# Patient Record
Sex: Male | Born: 1990 | Race: White | Hispanic: No | Marital: Single | Smoking: Never smoker
Health system: Southern US, Community
[De-identification: ages and names within clinical notes are randomized; demographics above are authoritative.]

## PROBLEM LIST (undated history)

## (undated) DIAGNOSIS — M25529 Pain in unspecified elbow: Secondary | ICD-10-CM

## (undated) DIAGNOSIS — Z9089 Acquired absence of other organs: Secondary | ICD-10-CM

## (undated) HISTORY — DX: Acquired absence of other organs: Z90.89

## (undated) HISTORY — DX: Pain in unspecified elbow: M25.529

## (undated) HISTORY — PX: TONSILLECTOMY AND ADENOIDECTOMY: SHX28

## (undated) HISTORY — PX: TONSILLECTOMY: SUR1361

---
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
## 2017-05-15 DIAGNOSIS — Z578 Occupational exposure to other risk factors: Secondary | ICD-10-CM | POA: Diagnosis not present
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
## 2017-05-16 ENCOUNTER — Emergency Department
Admission: EM | Admit: 2017-05-16 | Discharge: 2017-05-16 | Disposition: A | Discharge status: Home / Self Care | Payer: Worker's Compensation | Attending: Emergency Medicine | Admitting: Emergency Medicine

## 2017-05-16 DIAGNOSIS — Z578 Occupational exposure to other risk factors: Secondary | ICD-10-CM

## 2017-05-16 NOTE — Discharge Instructions (Signed)
Please keep your follow-up appointments with employee health as scheduled return to the emergency department for any concerns.
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
## 2019-04-06 ENCOUNTER — Other Ambulatory Visit: Payer: Self-pay

## 2019-04-06 ENCOUNTER — Emergency Department
Admission: EM | Admit: 2019-04-06 | Discharge: 2019-04-07 | Disposition: A | Discharge status: Home / Self Care | Payer: No Typology Code available for payment source | Attending: Student in an Organized Health Care Education/Training Program | Admitting: Student in an Organized Health Care Education/Training Program

## 2019-04-06 ENCOUNTER — Encounter: Payer: Self-pay | Admitting: Emergency Medicine

## 2019-04-06 ENCOUNTER — Emergency Department: Payer: No Typology Code available for payment source

## 2019-04-06 DIAGNOSIS — Y990 Civilian activity done for income or pay: Secondary | ICD-10-CM | POA: Diagnosis not present

## 2019-04-06 DIAGNOSIS — W1842XA Slipping, tripping and stumbling without falling due to stepping into hole or opening, initial encounter: Secondary | ICD-10-CM | POA: Insufficient documentation

## 2019-04-06 DIAGNOSIS — Y9389 Activity, other specified: Secondary | ICD-10-CM | POA: Insufficient documentation

## 2019-04-06 DIAGNOSIS — Y929 Unspecified place or not applicable: Secondary | ICD-10-CM | POA: Insufficient documentation

## 2019-04-06 DIAGNOSIS — S92154A Nondisplaced avulsion fracture (chip fracture) of right talus, initial encounter for closed fracture: Secondary | ICD-10-CM | POA: Insufficient documentation

## 2019-04-06 DIAGNOSIS — S93401A Sprain of unspecified ligament of right ankle, initial encounter: Secondary | ICD-10-CM | POA: Diagnosis not present

## 2019-04-06 DIAGNOSIS — S99911A Unspecified injury of right ankle, initial encounter: Secondary | ICD-10-CM | POA: Diagnosis present

## 2019-04-06 MED ORDER — IBUPROFEN 800 MG PO TABS
800.0000 mg | ORAL_TABLET | Freq: Once | ORAL | Status: AC
  Administered 2019-04-06: 23:00:00 800 mg via ORAL
  Filled 2019-04-06: qty 1

## 2019-04-06 NOTE — Discharge Instructions (Signed)
Wear the ankle brace until you are cleared by Ortho/Podiatry. Rest with the foot elevated when seated. Apply ice to reduce swelling. Follow-up with [facility] as discussed.

## 2019-04-06 NOTE — ED Triage Notes (Signed)
Pt to triage via w/c with no distress noted, mask in place; pt employed with [employer]; st was tracking and stepped in a hole injuring his left ankle; pt accomp by co-workers Richardson Landry who st per Ahmed Prima, requesting UDS only

## 2019-04-06 NOTE — ED Provider Notes (Signed)
[facility] Emergency Department Provider Note ____________________________________________  Time seen: 2237  I have reviewed the triage vital signs and the nursing notes.  HISTORY  Chief Complaint  Ankle Pain  HPI Gerald Ward is a 28 y.o. male Who works as a Sports coach for [employer], presents to the ED after an accidental injury.  Patient was tracking a suspect with his dog, when he accidentally stepped in a hole that was near the guard rail on the embankment.  He describes injuring his left ankle when he fell onto his right side.  He denies any head injury, laceration, abrasion, or any other injury at this time.   He presents at this time with pain, swelling, and disability to the left ankle.  He gives remote history of previous ankle sprains.  History reviewed. No pertinent past medical history.  There are no active problems to display for this patient.  History reviewed. No pertinent surgical history.  Prior to Admission medications   Not on File    Allergies Patient has no known allergies.  No family history on file.  Social History Social History   Tobacco Use  . Smoking status: Never Smoker  . Smokeless tobacco: Never Used  Substance Use Topics  . Alcohol use: Not on file  . Drug use: Not on file    Review of Systems  Constitutional: Negative for fever. Cardiovascular: Negative for chest pain. Respiratory: Negative for shortness of breath. Musculoskeletal: Negative for back pain. Left ankle pain as above Skin: Negative for rash. Neurological: Negative for headaches, focal weakness or numbness. ____________________________________________  PHYSICAL EXAM:  VITAL SIGNS: ED Triage Vitals [04/06/19 2123]  Enc Vitals Group     BP (!) 127/94     Pulse Rate 79     Resp 18     Temp 98.7 F (37.1 C)     Temp Source Oral     SpO2 97 %     Weight 200 lb (90.7 kg)     Height 6\' 5"  (1.956 m)     Head Circumference      Peak Flow       Pain Score 6     Pain Loc      Pain Edu?      Excl. in [place]?     Constitutional: Alert and oriented. Well appearing and in no distress. Head: Normocephalic and atraumatic. Eyes: Conjunctivae are normal. Normal extraocular movements Cardiovascular: Normal rate, regular rhythm. Normal distal pulses and capillary refill. Respiratory: Normal respiratory effort. No wheezes/rales/rhonchi. Musculoskeletal: Left ankle with obvious lateral soft tissue swelling noted.  No obvious deformity or dislocation is appreciated.  No ecchymosis, bruising, or abrasions noted.  Patient is tender to palpation to the lateral malleolus, the posterior medial lateral fossa of the Achilles tendon, and the calcaneus.  Is able demonstrate normal ankle flexion extension range.  Negative Thompson test.  Knee exam is benign.  Nontender with normal range of motion in all extremities.  Neurologic:  Normal gross sensation. Normal speech and language. No gross focal neurologic deficits are appreciated. Skin:  Skin is warm, dry and intact. No rash noted. ____________________________________________   RADIOLOGY  DG Left Ankle IMPRESSION: 1. Irregular trabecular disruption involving the talar neck, suspicious for possible fracture. 2. Additional lucency through a bony excrescence along the anterior tibiotalar joint, possibly reflecting a fragmented osteophyte versus more remote injury. 3. Circumferential swelling and small to moderate ankle effusion. 4. Additional corticated fragments distal to the mediolateral malleoli suggest remote injury  as well. ____________________________________________  PROCEDURES  IBU 800 mg PO .Splint Application  Date/Time: 04/06/2019 11:08 PM Performed by: Kern Reap A, NT Authorized by: Lissa Hoard, PA-C   Consent:    Consent obtained:  Verbal   Consent given by:  Patient   Risks discussed:  Pain   Alternatives discussed:  No treatment Pre-procedure details:     Sensation:  Normal Procedure details:    Laterality:  Left   Location:  Ankle   Ankle:  L ankle   Strapping: no     Splint type:  Sugar tong   Supplies:  Ortho-Glass, cotton padding and elastic bandage Post-procedure details:    Pain:  Improved   Sensation:  Normal   Patient tolerance of procedure:  Tolerated well, no immediate complications  ____________________________________________  INITIAL IMPRESSION / ASSESSMENT AND PLAN / ED COURSE  Patient presents to the ED for evaluation of injury sustained following mechanical fall.  Patient describes stepping in a hole, and falling while chasing a suspect.  He presents with pain to the lateral left ankle and disability.  X-ray does reveal a questionable talar dome injury.  Given patient's pain and disability he will be placed in a sugar tong OCL splint, and given crutches to ambulate nonweightbearing until he is evaluated by Ortho/podiatry.  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  Gerald Ward was evaluated in Emergency Department on 04/07/2019 for the symptoms described in the history of present illness. He was evaluated in the context of the global COVID-19 pandemic, which necessitated consideration that the patient might be at risk for infection with the SARS-CoV-2 virus that causes COVID-19. Institutional protocols and algorithms that pertain to the evaluation of patients at risk for COVID-19 are in a state of rapid change based on information released by regulatory bodies including the CDC and federal and state organizations. These policies and algorithms were followed during the patient's care in the ED. ____________________________________________  FINAL CLINICAL IMPRESSION(S) / ED DIAGNOSES  Final diagnoses:  Sprain of right ankle, unspecified ligament, initial encounter  Closed nondisplaced avulsion fracture of right talus, initial encounter      Lissa Hoard,  PA-C 04/07/19 0001    Willy Eddy, MD 04/09/19 [phone]

## 2019-04-08 ENCOUNTER — Telehealth: Payer: Self-pay

## 2019-04-08 NOTE — Telephone Encounter (Signed)
Workers [employer] Received ED visit notes for visit on 04/06/2019.  Notes reviewed by Laurey Morale, PA-C (Interim Provider).  Ramelo scheduled to follow-up with [facility] 03/2019.  AMD

## 2019-07-29 ENCOUNTER — Other Ambulatory Visit: Payer: Self-pay

## 2019-07-29 DIAGNOSIS — B009 Herpesviral infection, unspecified: Secondary | ICD-10-CM

## 2019-07-30 NOTE — Telephone Encounter (Signed)
Need pharmacy

## 2019-08-01 ENCOUNTER — Telehealth: Payer: Self-pay

## 2019-08-01 ENCOUNTER — Other Ambulatory Visit: Payer: Self-pay | Admitting: Physician Assistant

## 2019-08-01 DIAGNOSIS — B009 Herpesviral infection, unspecified: Secondary | ICD-10-CM

## 2019-08-01 MED ORDER — VALACYCLOVIR HCL 1 G PO TABS: ORAL_TABLET | ORAL | 1 refills | Status: DC

## 2019-08-01 NOTE — Telephone Encounter (Signed)
Durward Parcel, PA-C tried to send Rx Refill for Valacyclovir (Valtrex) 100 mg tablets electronically through Epic Take 2 tablets po q 12 hrs for 2 doses as directed Qty 20 1 Refill  The above Rx printed out 2x.  Called [facility] [phone]) & spoke with pharmacist Alycia Rossetti) & was informed Rx refill didn't come to them electronically.  Gave verbal order to [facility] for above Rx Refill .  AMD

## 2019-09-06 NOTE — Progress Notes (Signed)
Scheduled to complete physical 09/28/19 (Provider TBD).  AMD

## 2019-09-07 ENCOUNTER — Other Ambulatory Visit: Payer: Self-pay

## 2019-09-07 ENCOUNTER — Ambulatory Visit: Payer: Self-pay

## 2019-09-07 DIAGNOSIS — Z0000 Encounter for general adult medical examination without abnormal findings: Secondary | ICD-10-CM

## 2019-09-07 LAB — POCT URINALYSIS DIPSTICK
Bilirubin, UA: NEGATIVE
Blood, UA: NEGATIVE
Glucose, UA: NEGATIVE
Ketones, UA: NEGATIVE
Leukocytes, UA: NEGATIVE
Nitrite, UA: NEGATIVE
Protein, UA: POSITIVE — AB
Spec Grav, UA: 1.025 (ref 1.010–1.025)
Urobilinogen, UA: 0.2 E.U./dL
pH, UA: 6 (ref 5.0–8.0)

## 2019-09-08 LAB — CMP12+LP+TP+TSH+6AC+CBC/D/PLT
ALT: 55 IU/L — ABNORMAL HIGH (ref 0–44)
AST: 66 IU/L — ABNORMAL HIGH (ref 0–40)
Albumin/Globulin Ratio: 2 (ref 1.2–2.2)
Albumin: 4.6 g/dL (ref 4.1–5.2)
Alkaline Phosphatase: 44 IU/L (ref 39–117)
BUN/Creatinine Ratio: 12 (ref 9–20)
BUN: 15 mg/dL (ref 6–20)
Basophils Absolute: 0 10*3/uL (ref 0.0–0.2)
Basos: 1 %
Bilirubin Total: 0.6 mg/dL (ref 0.0–1.2)
Calcium: 9 mg/dL (ref 8.7–10.2)
Chloride: 102 mmol/L (ref 96–106)
Chol/HDL Ratio: 4.9 ratio (ref 0.0–5.0)
Cholesterol, Total: 133 mg/dL (ref 100–199)
Creatinine, Ser: 1.24 mg/dL (ref 0.76–1.27)
EOS (ABSOLUTE): 0 10*3/uL (ref 0.0–0.4)
Eos: 1 %
Estimated CHD Risk: 1 times avg. (ref 0.0–1.0)
Free Thyroxine Index: 1.9 (ref 1.2–4.9)
GFR calc Af Amer: 92 mL/min/{1.73_m2} (ref >59)
GFR calc non Af Amer: 80 mL/min/{1.73_m2} (ref >59)
GGT: 9 IU/L (ref 0–65)
Globulin, Total: 2.3 g/dL (ref 1.5–4.5)
Glucose: 86 mg/dL (ref 65–99)
HDL: 27 mg/dL — ABNORMAL LOW (ref >39)
Hematocrit: 45.3 % (ref 37.5–51.0)
Hemoglobin: 15.5 g/dL (ref 13.0–17.7)
Immature Grans (Abs): 0 10*3/uL (ref 0.0–0.1)
Immature Granulocytes: 0 %
Iron: 141 ug/dL (ref 38–169)
LDH: 253 IU/L — ABNORMAL HIGH (ref 121–224)
LDL Chol Calc (NIH): 93 mg/dL (ref 0–99)
Lymphocytes Absolute: 2.6 10*3/uL (ref 0.7–3.1)
Lymphs: 45 %
MCH: 32.2 pg (ref 26.6–33.0)
MCHC: 34.2 g/dL (ref 31.5–35.7)
MCV: 94 fL (ref 79–97)
Monocytes Absolute: 0.6 10*3/uL (ref 0.1–0.9)
Monocytes: 10 %
Neutrophils Absolute: 2.5 10*3/uL (ref 1.4–7.0)
Neutrophils: 43 %
Phosphorus: 2.6 mg/dL — ABNORMAL LOW (ref 2.8–4.1)
Platelets: 241 10*3/uL (ref 150–450)
Potassium: 4.3 mmol/L (ref 3.5–5.2)
RBC: 4.81 x10E6/uL (ref 4.14–5.80)
RDW: 11.8 % (ref 11.6–15.4)
Sodium: 140 mmol/L (ref 134–144)
T3 Uptake Ratio: 33 % (ref 24–39)
T4, Total: 5.7 ug/dL (ref 4.5–12.0)
TSH: 0.948 u[IU]/mL (ref 0.450–4.500)
Total Protein: 6.9 g/dL (ref 6.0–8.5)
Triglycerides: 60 mg/dL (ref 0–149)
Uric Acid: 5.9 mg/dL (ref 3.8–8.4)
VLDL Cholesterol Cal: 13 mg/dL (ref 5–40)
WBC: 5.7 10*3/uL (ref 3.4–10.8)

## 2019-09-28 ENCOUNTER — Encounter: Payer: Self-pay | Admitting: Emergency Medicine

## 2019-09-28 DIAGNOSIS — Z0000 Encounter for general adult medical examination without abnormal findings: Secondary | ICD-10-CM

## 2019-11-15 ENCOUNTER — Ambulatory Visit: Payer: Self-pay | Admitting: Physician Assistant

## 2019-11-15 ENCOUNTER — Encounter: Payer: Self-pay | Admitting: Physician Assistant

## 2019-11-15 ENCOUNTER — Other Ambulatory Visit: Payer: Self-pay

## 2019-11-15 VITALS — BP 130/90 | HR 81 | Temp 98.4°F | Resp 14 | Ht 77.0 in | Wt 208.0 lb

## 2019-11-15 DIAGNOSIS — Z0000 Encounter for general adult medical examination without abnormal findings: Secondary | ICD-10-CM

## 2019-11-15 NOTE — Progress Notes (Signed)
° °  Subjective: Annual physical exam    Patient ID: Gerald Ward, male    DOB: [birth year], 29 y.o.   MRN: [id number]  HPI Patient presents annual physical exam voices no concerns or complaints.   Review of Systems    Negative o Objective:   Physical Exam No acute distress.  BMI is 24.67.  HEENT is unremarkable.  Neck is supple for adenopathy or bruits.  Lungs clear to auscultation.  Heart regular rate and rhythm.  Abdomen with negative HSM, normoactive bowel sounds, soft nontender palpation.  No obvious deformity to upper or lower extremities.  Patient has full and equal range of motion of the upper and lower extremities.  No obvious deformity to the cervical lumbar spine.  Patient has full and equal range of motion of the cervical or lumbar spine.  Cranial nerves II through XII grossly intact.     Assessment & Plan:

## 2019-12-02 ENCOUNTER — Encounter: Payer: Self-pay | Admitting: Emergency Medicine

## 2019-12-02 ENCOUNTER — Other Ambulatory Visit: Payer: Self-pay

## 2019-12-02 ENCOUNTER — Emergency Department
Admission: EM | Admit: 2019-12-02 | Discharge: 2019-12-02 | Disposition: A | Discharge status: Home / Self Care | Payer: 59 | Attending: Emergency Medicine | Admitting: Emergency Medicine

## 2019-12-02 ENCOUNTER — Emergency Department: Payer: 59

## 2019-12-02 DIAGNOSIS — N50812 Left testicular pain: Secondary | ICD-10-CM | POA: Insufficient documentation

## 2019-12-02 DIAGNOSIS — N50819 Testicular pain, unspecified: Secondary | ICD-10-CM

## 2019-12-02 DIAGNOSIS — R52 Pain, unspecified: Secondary | ICD-10-CM

## 2019-12-02 LAB — COMPREHENSIVE METABOLIC PANEL
ALT: 40 U/L (ref 0–44)
AST: 35 U/L (ref 15–41)
Albumin: 4.6 g/dL (ref 3.5–5.0)
Alkaline Phosphatase: 57 U/L (ref 38–126)
Anion gap: 9 (ref 5–15)
BUN: 17 mg/dL (ref 6–20)
CO2: 25 mmol/L (ref 22–32)
Calcium: 9.2 mg/dL (ref 8.9–10.3)
Chloride: 103 mmol/L (ref 98–111)
Creatinine, Ser: 1.2 mg/dL (ref 0.61–1.24)
GFR calc Af Amer: >60 mL/min (ref >60)
GFR calc non Af Amer: >60 mL/min (ref >60)
Glucose, Bld: 101 mg/dL — ABNORMAL HIGH (ref 70–99)
Potassium: 3.9 mmol/L (ref 3.5–5.1)
Sodium: 137 mmol/L (ref 135–145)
Total Bilirubin: 0.9 mg/dL (ref 0.3–1.2)
Total Protein: 7.5 g/dL (ref 6.5–8.1)

## 2019-12-02 LAB — URINALYSIS, COMPLETE (UACMP) WITH MICROSCOPIC
Bacteria, UA: NONE SEEN
Bilirubin Urine: NEGATIVE
Glucose, UA: NEGATIVE mg/dL
Hgb urine dipstick: NEGATIVE
Ketones, ur: NEGATIVE mg/dL
Leukocytes,Ua: NEGATIVE
Nitrite: NEGATIVE
Protein, ur: NEGATIVE mg/dL
Specific Gravity, Urine: 1.017 (ref 1.005–1.030)
pH: 6 (ref 5.0–8.0)

## 2019-12-02 LAB — CBC
HCT: 42.6 % (ref 39.0–52.0)
Hemoglobin: 15.5 g/dL (ref 13.0–17.0)
MCH: 32.6 pg (ref 26.0–34.0)
MCHC: 36.4 g/dL — ABNORMAL HIGH (ref 30.0–36.0)
MCV: 89.7 fL (ref 80.0–100.0)
Platelets: 235 10*3/uL (ref 150–400)
RBC: 4.75 MIL/uL (ref 4.22–5.81)
RDW: 11.6 % (ref 11.5–15.5)
WBC: 7 10*3/uL (ref 4.0–10.5)
nRBC: 0 % (ref 0.0–0.2)

## 2019-12-02 MED ORDER — SODIUM CHLORIDE 0.9% FLUSH: 3.0000 mL | Freq: Once | INTRAVENOUS | Status: DC

## 2019-12-02 NOTE — ED Triage Notes (Signed)
Left groin pain on and off since yesterday.  No injury.

## 2019-12-02 NOTE — ED Provider Notes (Signed)
[facility] Emergency Department Provider Note  ____________________________________________  Time seen: Approximately 11:23 AM  I have reviewed the triage vital signs and the nursing notes.   HISTORY  Chief Complaint Groin Pain    HPI Gerald Ward is a 29 y.o. male who complains of left testicular pain that started yesterday.  Waxing and waning, ranging from 6/10-2/10 pain, currently feeling better.  Denies dysuria frequency urgency.  No trauma.  No hematuria.  No penile discharge.  Nonradiating.  No aggravating or alleviating factors.      Past Medical History:  Diagnosis Date  . Hx of tonsillectomy      There are no problems to display for this patient.    Past Surgical History:  Procedure Laterality Date  . TONSILLECTOMY    . TONSILLECTOMY AND ADENOIDECTOMY       Prior to Admission medications   Medication Sig Start Date End Date Taking? Authorizing Provider  valACYclovir (VALTREX) 1000 MG tablet Take 2 tablets by mouth every 12 hours for 2 doses as directed 08/01/19   Joni Reining, PA-C     Allergies Patient has no known allergies.   No family history on file.  Social History Social History   Tobacco Use  . Smoking status: Never Smoker  . Smokeless tobacco: Never Used  Substance Use Topics  . Alcohol use: Yes  . Drug use: Never    Review of Systems  Constitutional:   No fever or chills.  ENT:   No sore throat. No rhinorrhea. Cardiovascular:   No chest pain or syncope. Respiratory:   No dyspnea or cough. Gastrointestinal:   Negative for abdominal pain, vomiting and diarrhea.  Musculoskeletal:   Negative for focal pain or swelling All other systems reviewed and are negative except as documented above in ROS and HPI.  ____________________________________________   PHYSICAL EXAM:  VITAL SIGNS: ED Triage Vitals  Enc Vitals Group     BP 12/02/19 1034 (!) 129/99     Pulse Rate 12/02/19 1034 78     Resp 12/02/19 1034  18     Temp 12/02/19 1034 98 F (36.7 C)     Temp src --      SpO2 12/02/19 1034 97 %     Weight 12/02/19 1028 207 lb 3.7 oz (94 kg)     Height 12/02/19 1028 6\' 5"  (1.956 m)     Head Circumference --      Peak Flow --      Pain Score 12/02/19 1028 3     Pain Loc --      Pain Edu? --      Excl. in GC? --     Vital signs reviewed, nursing assessments reviewed.   Constitutional:   Alert and oriented. Non-toxic appearance. Eyes:   Conjunctivae are normal. EOMI. ENT      Head:   Normocephalic and atraumatic.      Nose:   Wearing a mask.      Mouth/Throat:   Wearing a mask.      Neck:   No meningismus. Full ROM. Hematological/Lymphatic/Immunilogical:   No inguinal lymphadenopathy. Cardiovascular:   RRR. Respiratory:   Normal respiratory effort without tachypnea/retractions. Gastrointestinal:   Soft and nontender. Non distended. There is no CVA tenderness.  No rebound, rigidity, or guarding. Genitourinary:   Normal, examined while standing, no inguinal hernia.  Mild tenderness left posterior testicle.  No horizontal lie.  No inflammatory changes.  No genital lesions. Musculoskeletal:   Normal range of  motion in all extremities. No joint effusions.  No lower extremity tenderness.  No edema. Neurologic:   Normal speech and language.  Motor grossly intact. No acute focal neurologic deficits are appreciated.  Skin:    Skin is warm, dry and intact. No rash noted.  No petechiae, purpura, or bullae.  ____________________________________________    LABS (pertinent positives/negatives) (all labs ordered are listed, but only abnormal results are displayed) Labs Reviewed  COMPREHENSIVE METABOLIC PANEL - Abnormal; Notable for the following components:      Result Value   Glucose, Bld 101 (*)    All other components within normal limits  CBC - Abnormal; Notable for the following components:   MCHC 36.4 (*)    All other components within normal limits  URINALYSIS, COMPLETE (UACMP) WITH  MICROSCOPIC - Abnormal; Notable for the following components:   Color, Urine YELLOW (*)    APPearance CLEAR (*)    All other components within normal limits   ____________________________________________   EKG    ____________________________________________    RADIOLOGY  US SCROTUM W/DOPPLER  Result Date: 12/02/2019 CLINICAL DATA:  Left testicular pain for 1 day EXAM: SCROTAL ULTRASOUND DOPPLER ULTRASOUND OF THE TESTICLES TECHNIQUE: Complete ultrasound examination of the testicles, epididymis, and other scrotal structures was performed. Color and spectral Doppler ultrasound were also utilized to evaluate blood flow to the testicles. COMPARISON:  None. FINDINGS: Right testicle Measurements: 4.6 x 2.5 x 2.6 cm. No mass or microlithiasis visualized. Left testicle Measurements: 5.0 x 1.9 x 2.6 cm. No mass or microlithiasis visualized. Right epididymis:  Normal in size and appearance. Left epididymis:  Normal in size and appearance. Hydrocele:  None visualized. Varicocele:  None visualized. Pulsed Doppler interrogation of both testes demonstrates normal low resistance arterial and venous waveforms bilaterally. IMPRESSION: Normal ultrasound examination of the bilateral testicles and scrotum. There is symmetric arterial and venous Doppler flow bilaterally. Electronically Signed   By: Lauralyn Primes M.D.   On: 12/02/2019 12:14    ____________________________________________   PROCEDURES Procedures  ____________________________________________  DIFFERENTIAL DIAGNOSIS   Epididymitis, functional pain, contusion, sliding hernia  CLINICAL IMPRESSION / ASSESSMENT AND PLAN / ED COURSE  Medications ordered in the ED: Medications  sodium chloride flush (NS) 0.9 % injection 3 mL (has no administration in time range)    Pertinent labs & imaging results that were available during my care of the patient were reviewed by me and considered in my medical decision making (see chart for  details).  Gerald Ward was evaluated in Emergency Department on 12/02/2019 for the symptoms described in the history of present illness. He was evaluated in the context of the global COVID-19 pandemic, which necessitated consideration that the patient might be at risk for infection with the SARS-CoV-2 virus that causes COVID-19. Institutional protocols and algorithms that pertain to the evaluation of patients at risk for COVID-19 are in a state of rapid change based on information released by regulatory bodies including the CDC and federal and state organizations. These policies and algorithms were followed during the patient's care in the ED.   Patient presents with intermittent left testicular pain.  Doubt torsion, UTI, STI.  No incarcerated hernia on exam.  Doubt diverticulitis, ureterolithiasis, appendicitis or other intra-abdominal pathology.  Labs unremarkable including creatinine, urinalysis.  Will obtain scrotal ultrasound with Doppler.   ----------------------------------------- 12:24 PM on 12/02/2019 -----------------------------------------  Scrotal ultrasound normal.  Will use supportive care with NSAIDs, supportive underwear, follow-up if not resolved in the next few days.  ____________________________________________   FINAL CLINICAL IMPRESSION(S) / ED DIAGNOSES    Final diagnoses:  Pain in testicle, unspecified laterality     ED Discharge Orders    None      Portions of this note were generated with dragon dictation software. Dictation errors may occur despite best attempts at proofreading.   Sharman Cheek, MD 12/02/19 1224

## 2019-12-02 NOTE — Discharge Instructions (Addendum)
Your lab tests and scrotum ultrasound were normal today.  Use anti-inflammatory medicine such as ibuprofen or naproxen for pain and wear supportive underwear for the next few days.  If pain has not resolved by Monday, please follow up with primary care.

## 2020-03-01 DIAGNOSIS — M62830 Muscle spasm of back: Secondary | ICD-10-CM | POA: Diagnosis not present

## 2020-03-01 DIAGNOSIS — M9903 Segmental and somatic dysfunction of lumbar region: Secondary | ICD-10-CM | POA: Diagnosis not present

## 2020-03-01 DIAGNOSIS — M955 Acquired deformity of pelvis: Secondary | ICD-10-CM | POA: Diagnosis not present

## 2020-03-01 DIAGNOSIS — M9905 Segmental and somatic dysfunction of pelvic region: Secondary | ICD-10-CM | POA: Diagnosis not present

## 2020-03-02 DIAGNOSIS — M955 Acquired deformity of pelvis: Secondary | ICD-10-CM | POA: Diagnosis not present

## 2020-03-02 DIAGNOSIS — M9903 Segmental and somatic dysfunction of lumbar region: Secondary | ICD-10-CM | POA: Diagnosis not present

## 2020-03-02 DIAGNOSIS — M62830 Muscle spasm of back: Secondary | ICD-10-CM | POA: Diagnosis not present

## 2020-03-02 DIAGNOSIS — M9905 Segmental and somatic dysfunction of pelvic region: Secondary | ICD-10-CM | POA: Diagnosis not present

## 2020-03-05 DIAGNOSIS — M955 Acquired deformity of pelvis: Secondary | ICD-10-CM | POA: Diagnosis not present

## 2020-03-05 DIAGNOSIS — M9903 Segmental and somatic dysfunction of lumbar region: Secondary | ICD-10-CM | POA: Diagnosis not present

## 2020-03-05 DIAGNOSIS — M62830 Muscle spasm of back: Secondary | ICD-10-CM | POA: Diagnosis not present

## 2020-03-05 DIAGNOSIS — M9905 Segmental and somatic dysfunction of pelvic region: Secondary | ICD-10-CM | POA: Diagnosis not present

## 2020-03-07 DIAGNOSIS — M9905 Segmental and somatic dysfunction of pelvic region: Secondary | ICD-10-CM | POA: Diagnosis not present

## 2020-03-07 DIAGNOSIS — M62830 Muscle spasm of back: Secondary | ICD-10-CM | POA: Diagnosis not present

## 2020-03-07 DIAGNOSIS — M9903 Segmental and somatic dysfunction of lumbar region: Secondary | ICD-10-CM | POA: Diagnosis not present

## 2020-03-07 DIAGNOSIS — M955 Acquired deformity of pelvis: Secondary | ICD-10-CM | POA: Diagnosis not present

## 2020-03-09 DIAGNOSIS — M9903 Segmental and somatic dysfunction of lumbar region: Secondary | ICD-10-CM | POA: Diagnosis not present

## 2020-03-09 DIAGNOSIS — M9905 Segmental and somatic dysfunction of pelvic region: Secondary | ICD-10-CM | POA: Diagnosis not present

## 2020-03-09 DIAGNOSIS — M955 Acquired deformity of pelvis: Secondary | ICD-10-CM | POA: Diagnosis not present

## 2020-03-09 DIAGNOSIS — M62830 Muscle spasm of back: Secondary | ICD-10-CM | POA: Diagnosis not present

## 2020-03-12 DIAGNOSIS — M9903 Segmental and somatic dysfunction of lumbar region: Secondary | ICD-10-CM | POA: Diagnosis not present

## 2020-03-12 DIAGNOSIS — M955 Acquired deformity of pelvis: Secondary | ICD-10-CM | POA: Diagnosis not present

## 2020-03-12 DIAGNOSIS — M9905 Segmental and somatic dysfunction of pelvic region: Secondary | ICD-10-CM | POA: Diagnosis not present

## 2020-03-12 DIAGNOSIS — M62830 Muscle spasm of back: Secondary | ICD-10-CM | POA: Diagnosis not present

## 2020-03-16 DIAGNOSIS — M9905 Segmental and somatic dysfunction of pelvic region: Secondary | ICD-10-CM | POA: Diagnosis not present

## 2020-03-16 DIAGNOSIS — M955 Acquired deformity of pelvis: Secondary | ICD-10-CM | POA: Diagnosis not present

## 2020-03-16 DIAGNOSIS — M62830 Muscle spasm of back: Secondary | ICD-10-CM | POA: Diagnosis not present

## 2020-03-16 DIAGNOSIS — M9903 Segmental and somatic dysfunction of lumbar region: Secondary | ICD-10-CM | POA: Diagnosis not present

## 2020-03-28 DIAGNOSIS — M62830 Muscle spasm of back: Secondary | ICD-10-CM | POA: Diagnosis not present

## 2020-03-28 DIAGNOSIS — M9901 Segmental and somatic dysfunction of cervical region: Secondary | ICD-10-CM | POA: Diagnosis not present

## 2020-03-28 DIAGNOSIS — M9905 Segmental and somatic dysfunction of pelvic region: Secondary | ICD-10-CM | POA: Diagnosis not present

## 2020-03-28 DIAGNOSIS — M955 Acquired deformity of pelvis: Secondary | ICD-10-CM | POA: Diagnosis not present

## 2020-03-30 DIAGNOSIS — B001 Herpesviral vesicular dermatitis: Secondary | ICD-10-CM | POA: Insufficient documentation

## 2020-04-02 DIAGNOSIS — M25512 Pain in left shoulder: Secondary | ICD-10-CM | POA: Diagnosis not present

## 2020-04-11 DIAGNOSIS — M9905 Segmental and somatic dysfunction of pelvic region: Secondary | ICD-10-CM | POA: Diagnosis not present

## 2020-04-11 DIAGNOSIS — M9901 Segmental and somatic dysfunction of cervical region: Secondary | ICD-10-CM | POA: Diagnosis not present

## 2020-04-11 DIAGNOSIS — M955 Acquired deformity of pelvis: Secondary | ICD-10-CM | POA: Diagnosis not present

## 2020-04-11 DIAGNOSIS — M62830 Muscle spasm of back: Secondary | ICD-10-CM | POA: Diagnosis not present

## 2020-04-19 DIAGNOSIS — M25512 Pain in left shoulder: Secondary | ICD-10-CM | POA: Diagnosis not present

## 2020-04-19 DIAGNOSIS — M7542 Impingement syndrome of left shoulder: Secondary | ICD-10-CM | POA: Diagnosis not present

## 2020-04-27 DIAGNOSIS — M25512 Pain in left shoulder: Secondary | ICD-10-CM | POA: Diagnosis not present

## 2020-04-27 DIAGNOSIS — M7542 Impingement syndrome of left shoulder: Secondary | ICD-10-CM | POA: Diagnosis not present

## 2020-05-02 DIAGNOSIS — M25512 Pain in left shoulder: Secondary | ICD-10-CM | POA: Diagnosis not present

## 2020-05-02 DIAGNOSIS — M7542 Impingement syndrome of left shoulder: Secondary | ICD-10-CM | POA: Diagnosis not present

## 2020-05-04 DIAGNOSIS — M25512 Pain in left shoulder: Secondary | ICD-10-CM | POA: Diagnosis not present

## 2020-05-04 DIAGNOSIS — M7542 Impingement syndrome of left shoulder: Secondary | ICD-10-CM | POA: Diagnosis not present

## 2020-07-30 ENCOUNTER — Other Ambulatory Visit: Payer: Self-pay

## 2020-07-30 DIAGNOSIS — B009 Herpesviral infection, unspecified: Secondary | ICD-10-CM

## 2020-07-30 MED ORDER — VALACYCLOVIR HCL 1 G PO TABS: ORAL_TABLET | ORAL | 1 refills | Status: DC

## 2020-08-03 ENCOUNTER — Other Ambulatory Visit: Payer: Self-pay

## 2020-08-03 ENCOUNTER — Ambulatory Visit: Payer: Self-pay

## 2020-08-03 DIAGNOSIS — B009 Herpesviral infection, unspecified: Secondary | ICD-10-CM

## 2020-08-03 DIAGNOSIS — Z01818 Encounter for other preprocedural examination: Secondary | ICD-10-CM

## 2020-08-03 MED ORDER — VALACYCLOVIR HCL 1 G PO TABS: ORAL_TABLET | ORAL | 1 refills | Status: DC

## 2020-08-03 NOTE — Progress Notes (Signed)
Pt scheduled to complete physical with Ron Smith,PA-C 08/15/20./CL,RMA

## 2020-08-04 LAB — CMP12+LP+TP+TSH+6AC+CBC/D/PLT
ALT: 20 IU/L (ref 0–44)
AST: 23 IU/L (ref 0–40)
Albumin/Globulin Ratio: 2.3 — ABNORMAL HIGH (ref 1.2–2.2)
Albumin: 4.6 g/dL (ref 4.1–5.2)
Alkaline Phosphatase: 63 IU/L (ref 44–121)
BUN/Creatinine Ratio: 17 (ref 9–20)
BUN: 21 mg/dL — ABNORMAL HIGH (ref 6–20)
Basophils Absolute: 0 10*3/uL (ref 0.0–0.2)
Basos: 0 %
Bilirubin Total: 0.7 mg/dL (ref 0.0–1.2)
Calcium: 9.2 mg/dL (ref 8.7–10.2)
Chloride: 105 mmol/L (ref 96–106)
Chol/HDL Ratio: 3.2 ratio (ref 0.0–5.0)
Cholesterol, Total: 161 mg/dL (ref 100–199)
Creatinine, Ser: 1.26 mg/dL (ref 0.76–1.27)
EOS (ABSOLUTE): 0.1 10*3/uL (ref 0.0–0.4)
Eos: 1 %
Estimated CHD Risk: <0.5 times avg. (ref 0.0–1.0)
Free Thyroxine Index: 2.3 (ref 1.2–4.9)
GGT: 20 IU/L (ref 0–65)
Globulin, Total: 2 g/dL (ref 1.5–4.5)
Glucose: 96 mg/dL (ref 65–99)
HDL: 51 mg/dL (ref >39)
Hematocrit: 45.3 % (ref 37.5–51.0)
Hemoglobin: 16.1 g/dL (ref 13.0–17.7)
Immature Grans (Abs): 0 10*3/uL (ref 0.0–0.1)
Immature Granulocytes: 0 %
Iron: 141 ug/dL (ref 38–169)
LDH: 151 IU/L (ref 121–224)
LDL Chol Calc (NIH): 99 mg/dL (ref 0–99)
Lymphocytes Absolute: 2.8 10*3/uL (ref 0.7–3.1)
Lymphs: 42 %
MCH: 33.8 pg — ABNORMAL HIGH (ref 26.6–33.0)
MCHC: 35.5 g/dL (ref 31.5–35.7)
MCV: 95 fL (ref 79–97)
Monocytes Absolute: 0.7 10*3/uL (ref 0.1–0.9)
Monocytes: 11 %
Neutrophils Absolute: 3 10*3/uL (ref 1.4–7.0)
Neutrophils: 46 %
Phosphorus: 3.9 mg/dL (ref 2.8–4.1)
Platelets: 201 10*3/uL (ref 150–450)
Potassium: 4 mmol/L (ref 3.5–5.2)
RBC: 4.76 x10E6/uL (ref 4.14–5.80)
RDW: 12.3 % (ref 11.6–15.4)
Sodium: 142 mmol/L (ref 134–144)
T3 Uptake Ratio: 33 % (ref 24–39)
T4, Total: 7.1 ug/dL (ref 4.5–12.0)
TSH: 1.56 u[IU]/mL (ref 0.450–4.500)
Total Protein: 6.6 g/dL (ref 6.0–8.5)
Triglycerides: 53 mg/dL (ref 0–149)
Uric Acid: 6.9 mg/dL (ref 3.8–8.4)
VLDL Cholesterol Cal: 11 mg/dL (ref 5–40)
WBC: 6.7 10*3/uL (ref 3.4–10.8)
eGFR: 80 mL/min/{1.73_m2} (ref >59)

## 2020-08-15 ENCOUNTER — Other Ambulatory Visit: Payer: Self-pay

## 2020-08-15 ENCOUNTER — Encounter: Payer: Self-pay | Admitting: Physician Assistant

## 2020-08-15 ENCOUNTER — Ambulatory Visit: Payer: Self-pay | Admitting: Physician Assistant

## 2020-08-15 VITALS — BP 123/65 | HR 47 | Temp 97.8°F | Resp 14 | Ht 77.0 in | Wt 200.0 lb

## 2020-08-15 DIAGNOSIS — Z0000 Encounter for general adult medical examination without abnormal findings: Secondary | ICD-10-CM

## 2020-08-15 NOTE — Progress Notes (Signed)
   Subjective: Annual physical exam    Patient ID: Gerald Ward, male    DOB: December 14, 1990, 30 y.o.   MRN: [id number]  HPI Patient presents annual physical exam voices no concerns or complaints   Review of Systems Negative    Objective:   Physical Exam No acute distress.  BP is 123/65, pulse is 47, respiration 14, temperature 97.8, 1% O2 sat on room air. HEENT is unremarkable.  Neck is supple without adenopathy or bruits.  Lungs are clear to auscultation.  Heart regular rate and rhythm.  Abdomen negative HSM, normoactive bowel sounds, soft, nontender to palpation.  No obvious deformity to the upper or lower extremities.  Patient has full and equal range of motion of the upper and lower extremities.  No obvious deformity to the cervical lumbar spine.  Patient has full and equal range of motion of the cervical lumbar spine.  Cranial nerves II through XII grossly intact.       Assessment & Plan: Well exam  Discussed no acute findings on lab results.  Patient advised follow-up as necessary.

## 2020-10-16 IMAGING — DX DG ANKLE COMPLETE 3+V*L*
3 series · 3 of 3 positions shown · non-contrast
Comparison: None.

CLINICAL DATA: Stepped in hole, injured left ankle

EXAM:
LEFT ANKLE COMPLETE - 3+ VIEW

[ankle ap]
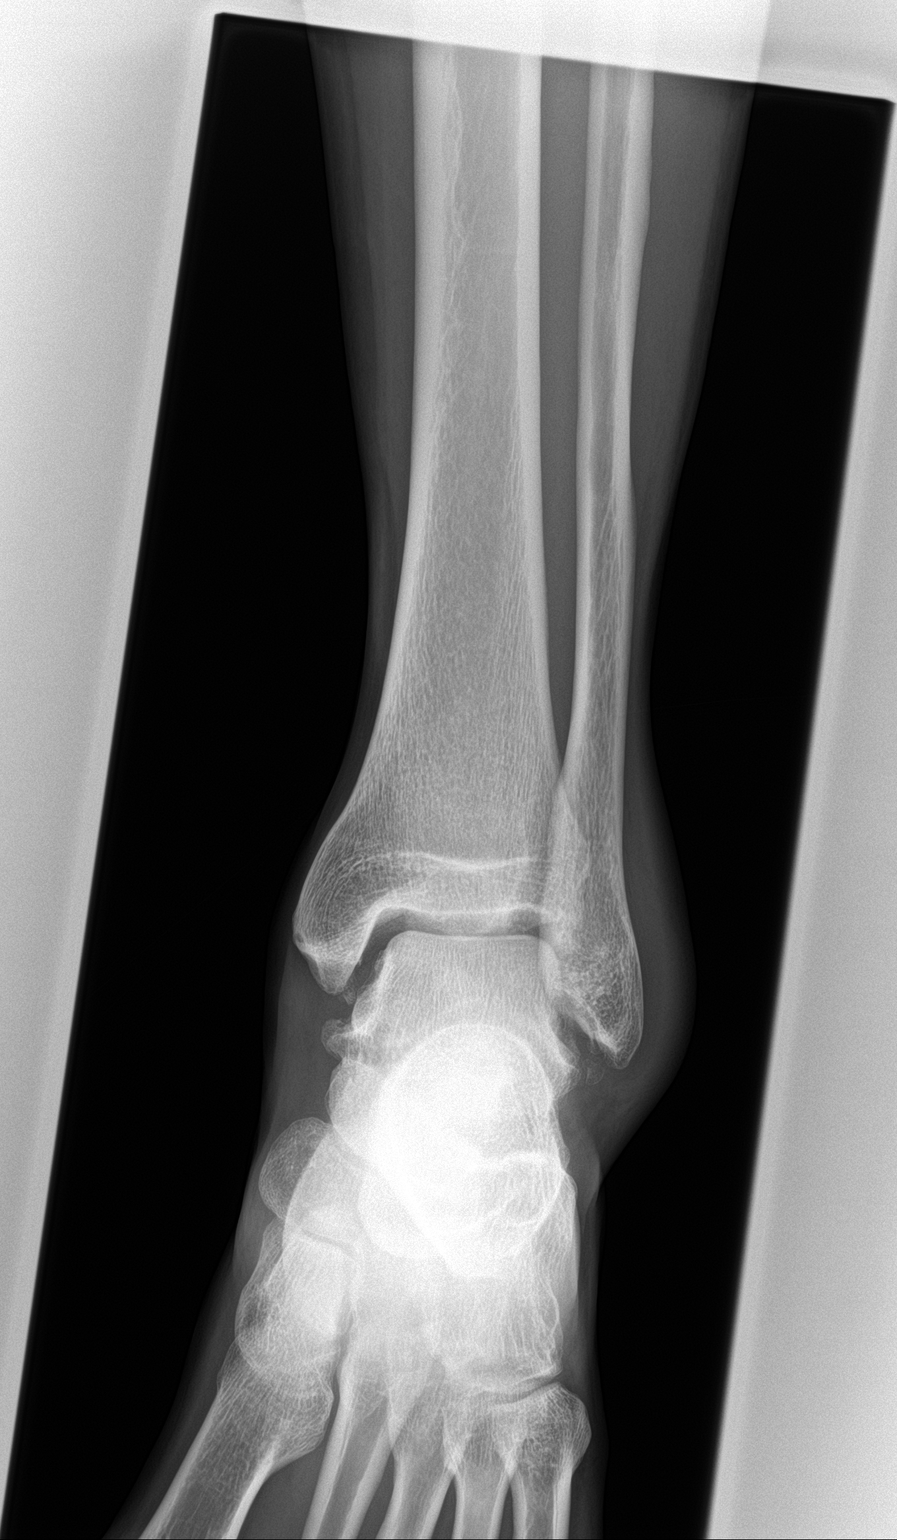

[ankle obl]
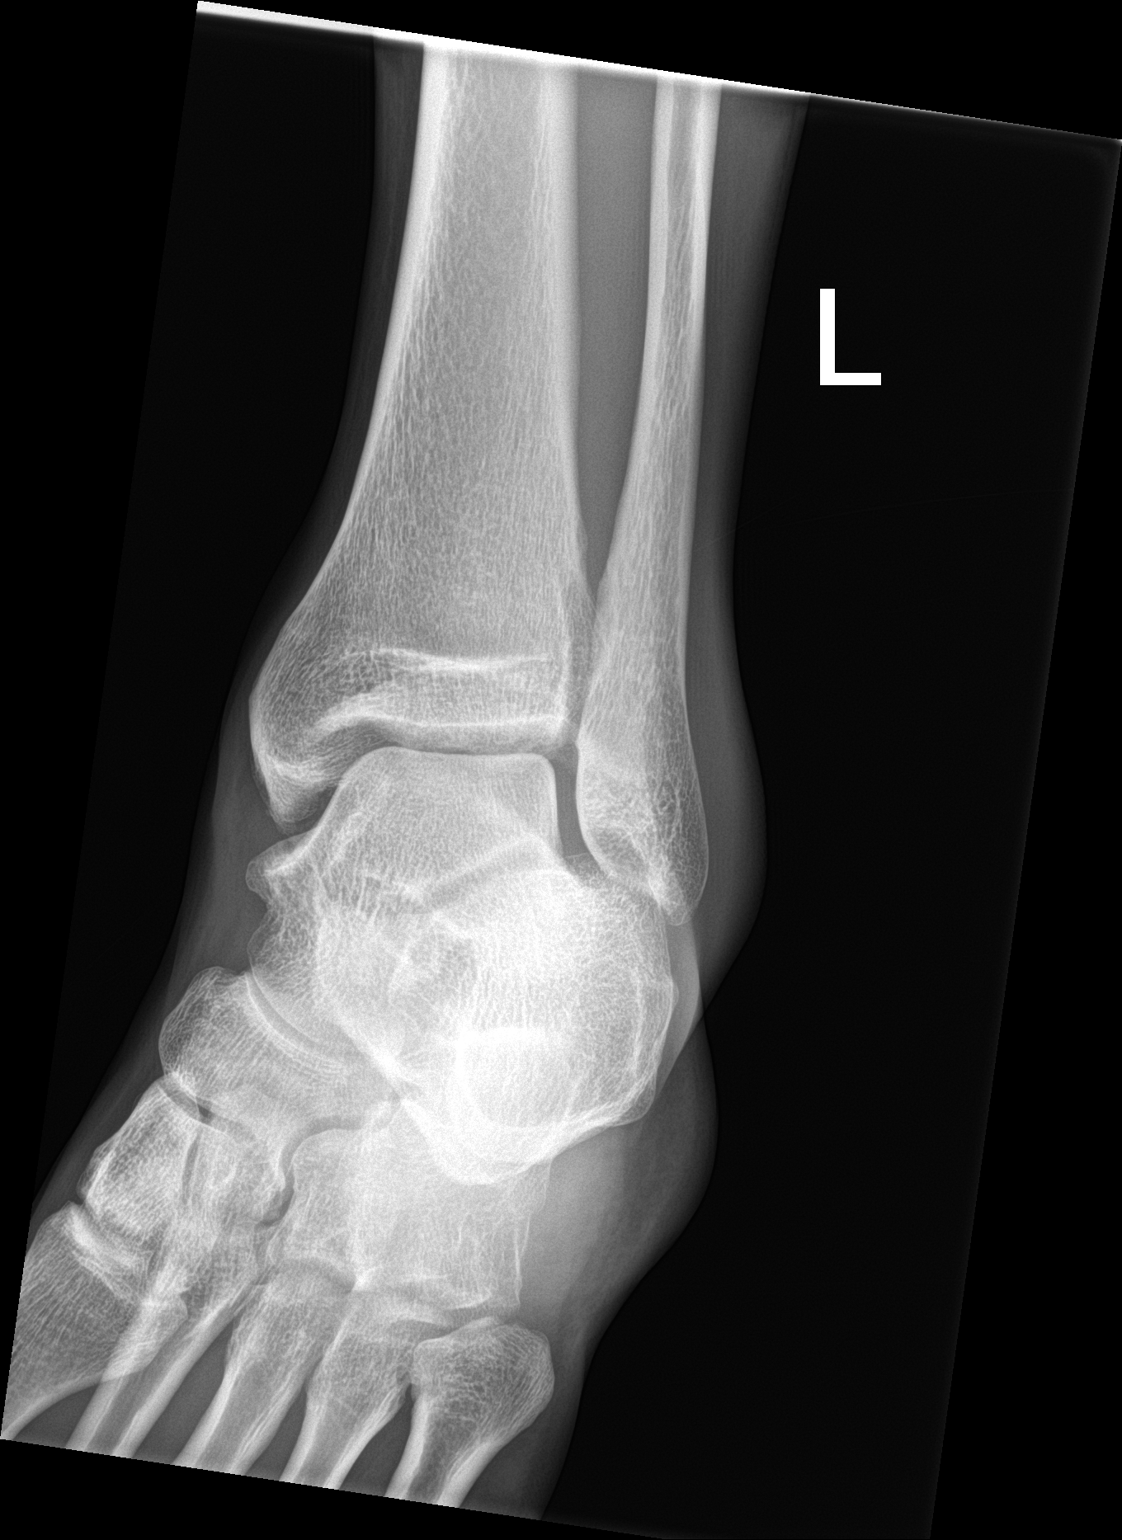

[ankle lat]
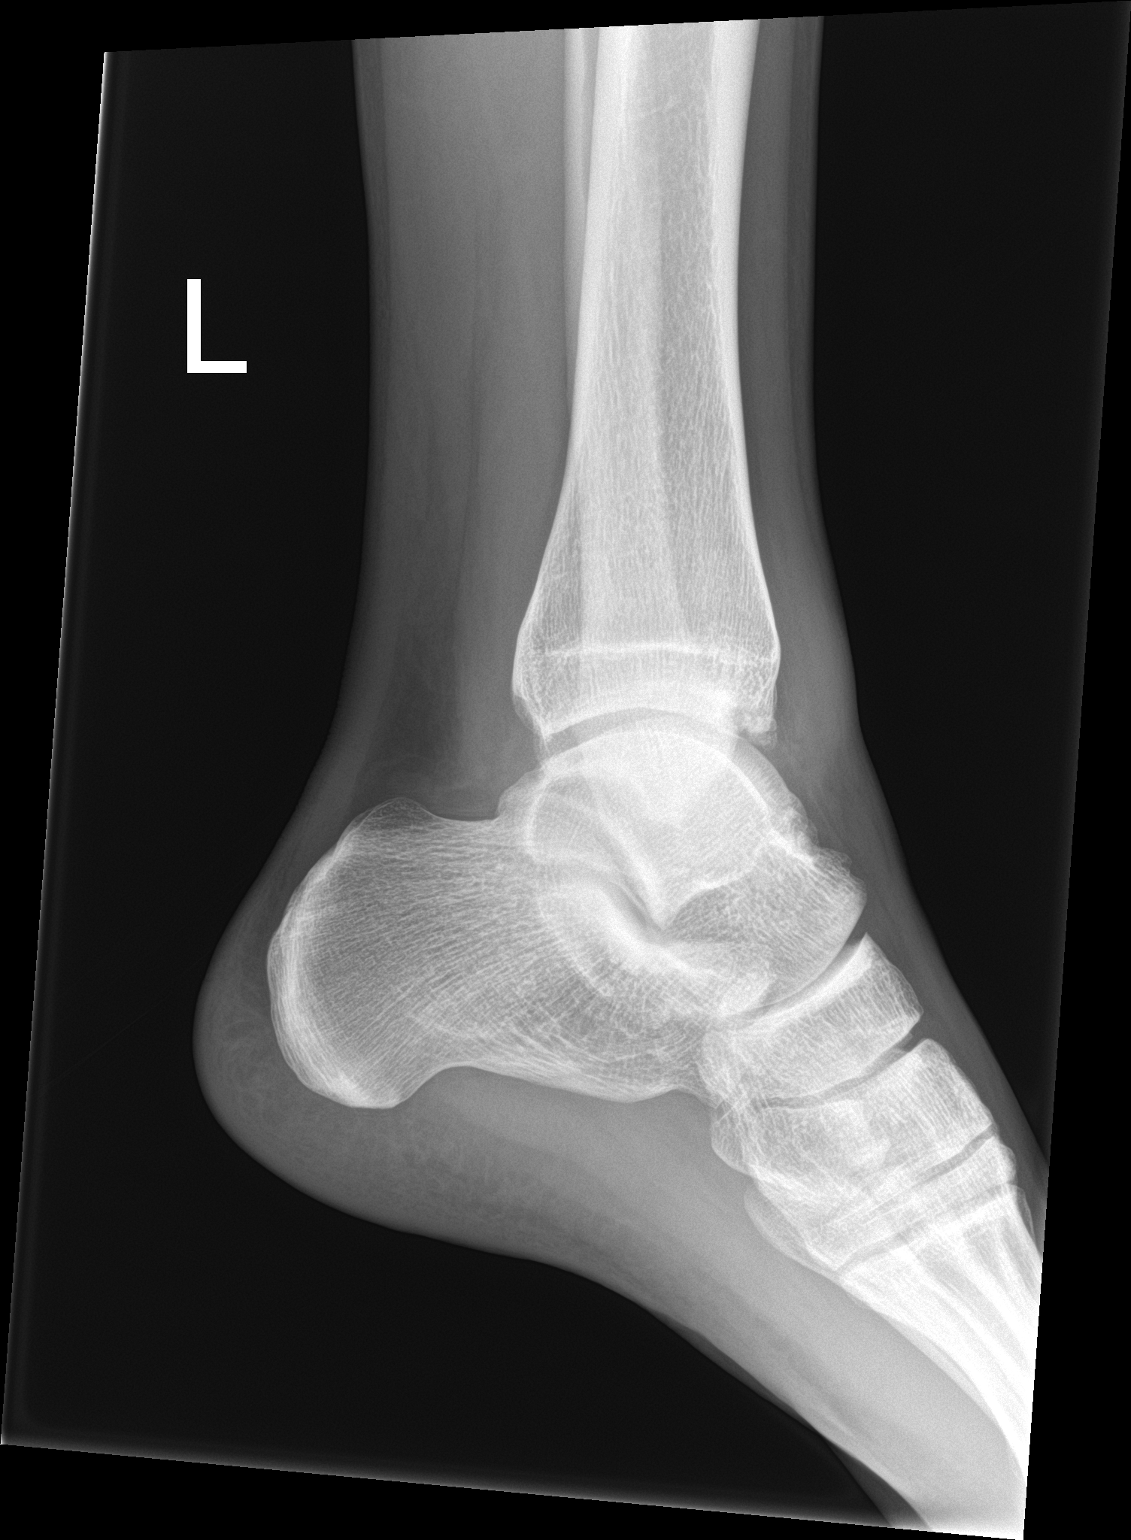

[3 of 3 positions shown; findings below may reference images not displayed]

FINDINGS: There is circumferential soft tissue swelling most pronounced over
the lateral malleolus. A small ankle joint effusion is present.
Multiple corticated appearing fragments distal to the medial and
lateral malleoli have an appearance more compatible with remote
injury. There is irregular trabecular disruption involving the talar
neck. Furthermore questionable lucency is noted through a bony
excrescence along the anterior tibiotalar joint which may reflect a
fragmented osteophyte poorly visualized on additional views. The
ankle mortise is congruent. Included portions of the mid and
hindfoot are grossly normal on this nondedicated, nonweightbearing
radiograph.
IMPRESSION: 1. Irregular trabecular disruption involving the talar neck,
suspicious for possible fracture.
2. Additional lucency through a bony excrescence along the anterior
tibiotalar joint, possibly reflecting a fragmented osteophyte versus
more remote injury.
3. Circumferential swelling and small to moderate ankle effusion.
4. Additional corticated fragments distal to the mediolateral
malleoli suggest remote injury as well.

## 2021-01-15 DIAGNOSIS — M62830 Muscle spasm of back: Secondary | ICD-10-CM | POA: Diagnosis not present

## 2021-01-15 DIAGNOSIS — M955 Acquired deformity of pelvis: Secondary | ICD-10-CM | POA: Diagnosis not present

## 2021-01-15 DIAGNOSIS — M9901 Segmental and somatic dysfunction of cervical region: Secondary | ICD-10-CM | POA: Diagnosis not present

## 2021-01-15 DIAGNOSIS — M9905 Segmental and somatic dysfunction of pelvic region: Secondary | ICD-10-CM | POA: Diagnosis not present

## 2021-01-17 DIAGNOSIS — M62830 Muscle spasm of back: Secondary | ICD-10-CM | POA: Diagnosis not present

## 2021-01-17 DIAGNOSIS — M9901 Segmental and somatic dysfunction of cervical region: Secondary | ICD-10-CM | POA: Diagnosis not present

## 2021-01-17 DIAGNOSIS — M955 Acquired deformity of pelvis: Secondary | ICD-10-CM | POA: Diagnosis not present

## 2021-01-17 DIAGNOSIS — M9905 Segmental and somatic dysfunction of pelvic region: Secondary | ICD-10-CM | POA: Diagnosis not present

## 2021-01-23 ENCOUNTER — Encounter: Payer: Self-pay | Admitting: Physician Assistant

## 2021-01-23 ENCOUNTER — Other Ambulatory Visit: Payer: Self-pay

## 2021-01-23 ENCOUNTER — Ambulatory Visit: Payer: 59 | Admitting: Physician Assistant

## 2021-01-23 DIAGNOSIS — M25551 Pain in right hip: Secondary | ICD-10-CM

## 2021-01-23 DIAGNOSIS — M7918 Myalgia, other site: Secondary | ICD-10-CM

## 2021-01-23 MED ORDER — NAPROXEN 500 MG PO TABS: 500.0000 mg | ORAL_TABLET | Freq: Two times a day (BID) | ORAL | Status: DC

## 2021-01-23 MED ORDER — ORPHENADRINE CITRATE ER 100 MG PO TB12: 100.0000 mg | ORAL_TABLET | Freq: Two times a day (BID) | ORAL | 0 refills | Status: DC
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
## 2021-01-24 ENCOUNTER — Ambulatory Visit
Admission: RE | Admit: 2021-01-24 | Discharge: 2021-01-24 | Disposition: A | Discharge status: Home / Self Care | Payer: No Typology Code available for payment source | Source: Ambulatory Visit | Attending: Physician Assistant | Admitting: Physician Assistant

## 2021-01-24 ENCOUNTER — Encounter: Payer: Self-pay | Admitting: Physician Assistant

## 2021-01-24 ENCOUNTER — Other Ambulatory Visit: Payer: Self-pay | Admitting: Physician Assistant

## 2021-01-24 ENCOUNTER — Ambulatory Visit: Payer: Self-pay | Admitting: Physician Assistant

## 2021-01-24 ENCOUNTER — Ambulatory Visit
Admission: RE | Admit: 2021-01-24 | Discharge: 2021-01-24 | Disposition: A | Discharge status: Home / Self Care | Payer: No Typology Code available for payment source | Attending: Physician Assistant | Admitting: Physician Assistant

## 2021-01-24 VITALS — BP 127/70 | HR 63 | Temp 98.0°F | Resp 14 | Ht 77.0 in | Wt 220.0 lb

## 2021-01-24 DIAGNOSIS — M7918 Myalgia, other site: Secondary | ICD-10-CM

## 2021-01-24 DIAGNOSIS — M25562 Pain in left knee: Secondary | ICD-10-CM | POA: Diagnosis not present

## 2021-01-24 DIAGNOSIS — M25551 Pain in right hip: Secondary | ICD-10-CM | POA: Diagnosis present
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
## 2021-03-05 DIAGNOSIS — Z03818 Encounter for observation for suspected exposure to other biological agents ruled out: Secondary | ICD-10-CM | POA: Diagnosis not present

## 2021-03-05 DIAGNOSIS — Z20822 Contact with and (suspected) exposure to covid-19: Secondary | ICD-10-CM | POA: Diagnosis not present

## 2021-03-06 ENCOUNTER — Other Ambulatory Visit: Payer: Self-pay

## 2021-03-06 DIAGNOSIS — B009 Herpesviral infection, unspecified: Secondary | ICD-10-CM

## 2021-03-06 DIAGNOSIS — R6889 Other general symptoms and signs: Secondary | ICD-10-CM | POA: Diagnosis not present

## 2021-03-06 MED ORDER — VALACYCLOVIR HCL 1 G PO TABS: ORAL_TABLET | ORAL | 1 refills | Status: DC

## 2021-03-14 ENCOUNTER — Encounter: Payer: Self-pay | Admitting: Physician Assistant

## 2021-03-14 ENCOUNTER — Telehealth: Payer: Self-pay

## 2021-03-14 ENCOUNTER — Ambulatory Visit: Payer: Self-pay | Admitting: Physician Assistant

## 2021-03-14 ENCOUNTER — Other Ambulatory Visit: Payer: Self-pay

## 2021-03-14 DIAGNOSIS — B349 Viral infection, unspecified: Secondary | ICD-10-CM

## 2021-03-14 DIAGNOSIS — R6889 Other general symptoms and signs: Secondary | ICD-10-CM

## 2021-03-14 LAB — POCT INFLUENZA A/B
Influenza A, POC: NEGATIVE
Influenza B, POC: NEGATIVE

## 2021-03-14 MED ORDER — PSEUDOEPH-BROMPHEN-DM 30-2-10 MG/5ML PO SYRP: 5.0000 mL | ORAL_SOLUTION | Freq: Four times a day (QID) | ORAL | 0 refills | Status: DC | PRN

## 2021-03-14 MED ORDER — IBUPROFEN 800 MG PO TABS: 800.0000 mg | ORAL_TABLET | Freq: Three times a day (TID) | ORAL | 0 refills | Status: DC | PRN

## 2021-03-14 NOTE — Progress Notes (Signed)
Rapid Flu A/B Test = Negative  S/Sx since Tuesday: "Severe body aches" Sore throat Nasal congestion Mild cough  Rapid Home Covid Test = Negative  Denies fever (has checked with a thermometer) Denies Nausea & Vomiting Denies Diarrhea  Hasn't had a flu shot this year  Zithromax causes upset stomach  Taking Nyquil & Dayquil  AMD

## 2021-03-14 NOTE — Addendum Note (Signed)
Addended by: Gardner Candle on: 03/14/2021 02:29 PM   Modules accepted: Orders

## 2021-03-14 NOTE — Telephone Encounter (Signed)
Risa Grill called to report severe body aches, nasal congestion and sore throat. PCR test yesterday was negative. Rapid flu test scheduled today followed by telehealth visit with provider.

## 2021-03-14 NOTE — Progress Notes (Signed)
   Subjective: Viral illness    Patient ID: Gerald Ward, male    DOB: November 21, 1990, 30 y.o.   MRN: [id number]  HPI Patient complaining of cute onset of generalized body aches, nasal congestion, sore throat, and cough.  Denies recent travel or known contact with COVID-19.  Patient has taken COVID-vaccine with boosters.  Patient is not taking the flu shot.   Review of Systems Negative except for both complaints.    Objective:   Physical Exam  This is a virtual visit.      Assessment & Plan: Viral illness   Patient will have a rapid flu test today and a COVID PCR.  Patient given prescription for Bromfed-DM and ibuprofen.  Patient to continue self-isolation pending results of PCR COVID test.

## 2021-03-15 LAB — NOVEL CORONAVIRUS, NAA: SARS-CoV-2, NAA: NOT DETECTED

## 2021-03-15 LAB — SARS-COV-2, NAA 2 DAY TAT

## 2021-05-28 ENCOUNTER — Ambulatory Visit: Payer: 59 | Admitting: Physician Assistant

## 2021-05-28 ENCOUNTER — Other Ambulatory Visit: Payer: Self-pay

## 2021-05-28 ENCOUNTER — Encounter: Payer: Self-pay | Admitting: Physician Assistant

## 2021-05-28 ENCOUNTER — Other Ambulatory Visit: Payer: 59

## 2021-05-28 DIAGNOSIS — Z1152 Encounter for screening for COVID-19: Secondary | ICD-10-CM

## 2021-05-28 DIAGNOSIS — J020 Streptococcal pharyngitis: Secondary | ICD-10-CM

## 2021-05-28 LAB — POC COVID19 BINAXNOW: SARS Coronavirus 2 Ag: NEGATIVE

## 2021-05-28 LAB — POCT RAPID STREP A (OFFICE): Rapid Strep A Screen: POSITIVE — AB

## 2021-05-28 LAB — POCT INFLUENZA A/B
Influenza A, POC: NEGATIVE
Influenza B, POC: NEGATIVE

## 2021-05-28 MED ORDER — PSEUDOEPH-BROMPHEN-DM 30-2-10 MG/5ML PO SYRP: 5.0000 mL | ORAL_SOLUTION | Freq: Four times a day (QID) | ORAL | 0 refills | Status: DC | PRN

## 2021-05-28 MED ORDER — LIDOCAINE VISCOUS HCL 2 % MT SOLN: 5.0000 mL | Freq: Four times a day (QID) | OROMUCOSAL | 0 refills | Status: DC | PRN

## 2021-05-28 MED ORDER — AMOXICILLIN 875 MG PO TABS: 875.0000 mg | ORAL_TABLET | Freq: Two times a day (BID) | ORAL | 0 refills | Status: AC

## 2021-05-28 NOTE — Progress Notes (Signed)
° °  Subjective: Sore throat    Patient ID: Gerald Ward, male    DOB: Apr 01, 1991, 31 y.o.   MRN: [id number]  HPI Patient presents with 4 days of sore throat.  Patient denies other URI signs and symptoms.  Patient test positive for strep pharyngitis   Review of Systems Negative    Objective:   Physical Exam  This is a virtual visit      Assessment & Plan: Strep pharyngitis   Patient given a prescription for amoxicillin, viscous lidocaine to mix with Bromfed-DM for swish and swallow.  Follow-up in 2 days if no improvement or worsening complaint.

## 2021-05-28 NOTE — Progress Notes (Signed)
Pt presents today with sore throat since 05/23/21. Fever, sore throat,slight aches.

## 2021-05-28 NOTE — Progress Notes (Deleted)
Established Patient Office Visit  Subjective:  Patient ID: Gerald Ward, male    DOB: 1990/08/19  Age: 31 y.o. MRN: [id number]  CC: No chief complaint on file.   HPI Gerald Ward presents for ***  Past Medical History:  Diagnosis Date   Elbow pain     [facility] ([facility])   Hx of tonsillectomy     Past Surgical History:  Procedure Laterality Date   TONSILLECTOMY     TONSILLECTOMY AND ADENOIDECTOMY      No family history on file.  Social History   Socioeconomic History   Marital status: Single    Spouse name: Not on file   Number of children: Not on file   Years of education: Not on file   Highest education level: Not on file  Occupational History   Not on file  Tobacco Use   Smoking status: Never   Smokeless tobacco: Never  Substance and Sexual Activity   Alcohol use: Yes   Drug use: Never   Sexual activity: Yes  Other Topics Concern   Not on file  Social History Narrative   Not on file   Social Determinants of Health   Financial Resource Strain: Not on file  Food Insecurity: Not on file  Transportation Needs: Not on file  Physical Activity: Not on file  Stress: Not on file  Social Connections: Not on file  Intimate Partner Violence: Not on file    Outpatient Medications Prior to Visit  Medication Sig Dispense Refill   brompheniramine-pseudoephedrine-DM 30-2-10 MG/5ML syrup Take 5 mLs by mouth 4 (four) times daily as needed. 120 mL 0   ibuprofen (ADVIL) 800 MG tablet Take 1 tablet (800 mg total) by mouth every 8 (eight) hours as needed for moderate pain. 15 tablet 0   naproxen (NAPROSYN) 500 MG tablet Take 1 tablet (500 mg total) by mouth 2 (two) times daily with a meal. 20 tablet 00   orphenadrine (NORFLEX) 100 MG tablet Take 1 tablet (100 mg total) by mouth 2 (two) times daily. (Patient not taking: Reported on 03/14/2021) 10 tablet 0   valACYclovir (VALTREX) 1000 MG tablet Take 2 tablets by mouth every 12 hours for 2 doses as directed 20  tablet 1   No facility-administered medications prior to visit.    No Known Allergies  ROS Review of Systems    Objective:    Physical Exam  There were no vitals taken for this visit. Wt Readings from Last 3 Encounters:  01/24/21 220 lb (99.8 kg)  01/23/21 220 lb (99.8 kg)  08/15/20 200 lb (90.7 kg)     Health Maintenance Due  Topic Date Due   HIV Screening  Never done   Hepatitis C Screening  Never done   TETANUS/TDAP  02/21/2014   INFLUENZA VACCINE  Never done    There are no preventive care reminders to display for this patient.  Lab Results  Component Value Date   TSH 1.560 08/03/2020   Lab Results  Component Value Date   WBC 6.7 08/03/2020   HGB 16.1 08/03/2020   HCT 45.3 08/03/2020   MCV 95 08/03/2020   PLT 201 08/03/2020   Lab Results  Component Value Date   NA 142 08/03/2020   K 4.0 08/03/2020   CO2 25 12/02/2019   GLUCOSE 96 08/03/2020   BUN 21 (H) 08/03/2020   CREATININE 1.26 08/03/2020   BILITOT 0.7 08/03/2020   ALKPHOS 63 08/03/2020   AST 23 08/03/2020  ALT 20 08/03/2020   PROT 6.6 08/03/2020   ALBUMIN 4.6 08/03/2020   CALCIUM 9.2 08/03/2020   ANIONGAP 9 12/02/2019   EGFR 80 08/03/2020   Lab Results  Component Value Date   CHOL 161 08/03/2020   Lab Results  Component Value Date   HDL 51 08/03/2020   Lab Results  Component Value Date   LDLCALC 99 08/03/2020   Lab Results  Component Value Date   TRIG 53 08/03/2020   Lab Results  Component Value Date   CHOLHDL 3.2 08/03/2020   No results found for: HGBA1C    Assessment & Plan:   Problem List Items Addressed This Visit   None   No orders of the defined types were placed in this encounter.   Follow-up: No follow-ups on file.    Sable Feil, PA-C

## 2021-08-01 DIAGNOSIS — M9905 Segmental and somatic dysfunction of pelvic region: Secondary | ICD-10-CM | POA: Diagnosis not present

## 2021-08-01 DIAGNOSIS — M9901 Segmental and somatic dysfunction of cervical region: Secondary | ICD-10-CM | POA: Diagnosis not present

## 2021-08-01 DIAGNOSIS — M955 Acquired deformity of pelvis: Secondary | ICD-10-CM | POA: Diagnosis not present

## 2021-08-01 DIAGNOSIS — M62830 Muscle spasm of back: Secondary | ICD-10-CM | POA: Diagnosis not present

## 2021-08-05 DIAGNOSIS — M9901 Segmental and somatic dysfunction of cervical region: Secondary | ICD-10-CM | POA: Diagnosis not present

## 2021-08-05 DIAGNOSIS — M955 Acquired deformity of pelvis: Secondary | ICD-10-CM | POA: Diagnosis not present

## 2021-08-05 DIAGNOSIS — M9905 Segmental and somatic dysfunction of pelvic region: Secondary | ICD-10-CM | POA: Diagnosis not present

## 2021-08-05 DIAGNOSIS — M62830 Muscle spasm of back: Secondary | ICD-10-CM | POA: Diagnosis not present

## 2021-08-12 ENCOUNTER — Other Ambulatory Visit: Payer: Self-pay | Admitting: Physician Assistant

## 2021-08-12 ENCOUNTER — Other Ambulatory Visit: Payer: Self-pay

## 2021-08-12 DIAGNOSIS — B009 Herpesviral infection, unspecified: Secondary | ICD-10-CM

## 2021-08-12 DIAGNOSIS — M62830 Muscle spasm of back: Secondary | ICD-10-CM | POA: Diagnosis not present

## 2021-08-12 DIAGNOSIS — M9901 Segmental and somatic dysfunction of cervical region: Secondary | ICD-10-CM | POA: Diagnosis not present

## 2021-08-12 DIAGNOSIS — M955 Acquired deformity of pelvis: Secondary | ICD-10-CM | POA: Diagnosis not present

## 2021-08-12 DIAGNOSIS — M9905 Segmental and somatic dysfunction of pelvic region: Secondary | ICD-10-CM | POA: Diagnosis not present

## 2021-08-12 MED ORDER — VALACYCLOVIR HCL 1 G PO TABS: ORAL_TABLET | ORAL | 1 refills | Status: DC

## 2021-08-20 DIAGNOSIS — M955 Acquired deformity of pelvis: Secondary | ICD-10-CM | POA: Diagnosis not present

## 2021-08-20 DIAGNOSIS — M9905 Segmental and somatic dysfunction of pelvic region: Secondary | ICD-10-CM | POA: Diagnosis not present

## 2021-08-20 DIAGNOSIS — M9901 Segmental and somatic dysfunction of cervical region: Secondary | ICD-10-CM | POA: Diagnosis not present

## 2021-08-20 DIAGNOSIS — M62830 Muscle spasm of back: Secondary | ICD-10-CM | POA: Diagnosis not present

## 2021-09-05 DIAGNOSIS — M62830 Muscle spasm of back: Secondary | ICD-10-CM | POA: Diagnosis not present

## 2021-09-05 DIAGNOSIS — M9901 Segmental and somatic dysfunction of cervical region: Secondary | ICD-10-CM | POA: Diagnosis not present

## 2021-09-05 DIAGNOSIS — M955 Acquired deformity of pelvis: Secondary | ICD-10-CM | POA: Diagnosis not present

## 2021-09-05 DIAGNOSIS — M9905 Segmental and somatic dysfunction of pelvic region: Secondary | ICD-10-CM | POA: Diagnosis not present

## 2021-09-17 DIAGNOSIS — M9901 Segmental and somatic dysfunction of cervical region: Secondary | ICD-10-CM | POA: Diagnosis not present

## 2021-09-17 DIAGNOSIS — M9905 Segmental and somatic dysfunction of pelvic region: Secondary | ICD-10-CM | POA: Diagnosis not present

## 2021-09-17 DIAGNOSIS — M62830 Muscle spasm of back: Secondary | ICD-10-CM | POA: Diagnosis not present

## 2021-09-17 DIAGNOSIS — M955 Acquired deformity of pelvis: Secondary | ICD-10-CM | POA: Diagnosis not present

## 2021-10-01 DIAGNOSIS — M9905 Segmental and somatic dysfunction of pelvic region: Secondary | ICD-10-CM | POA: Diagnosis not present

## 2021-10-01 DIAGNOSIS — M62830 Muscle spasm of back: Secondary | ICD-10-CM | POA: Diagnosis not present

## 2021-10-01 DIAGNOSIS — M9901 Segmental and somatic dysfunction of cervical region: Secondary | ICD-10-CM | POA: Diagnosis not present

## 2021-10-01 DIAGNOSIS — M955 Acquired deformity of pelvis: Secondary | ICD-10-CM | POA: Diagnosis not present

## 2021-10-18 ENCOUNTER — Ambulatory Visit: Payer: Self-pay

## 2021-10-18 DIAGNOSIS — Z0000 Encounter for general adult medical examination without abnormal findings: Secondary | ICD-10-CM

## 2021-10-18 NOTE — Addendum Note (Signed)
Addended by: Christianne Dolin F on: 10/18/2021 02:15 PM   Modules accepted: Orders

## 2021-10-18 NOTE — Progress Notes (Signed)
Pt presents today for physical labs, will return to clinic for scheduled physical.  

## 2021-10-22 LAB — CMP12+LP+TP+TSH+6AC+CBC/D/PLT
ALT: 46 IU/L — ABNORMAL HIGH (ref 0–44)
AST: 43 IU/L — ABNORMAL HIGH (ref 0–40)
Albumin/Globulin Ratio: 2 (ref 1.2–2.2)
Albumin: 4.4 g/dL (ref 4.1–5.2)
Alkaline Phosphatase: 73 IU/L (ref 44–121)
BUN/Creatinine Ratio: 19 (ref 9–20)
BUN: 20 mg/dL (ref 6–20)
Basophils Absolute: 0 10*3/uL (ref 0.0–0.2)
Basos: 1 %
Bilirubin Total: 0.2 mg/dL (ref 0.0–1.2)
Calcium: 9 mg/dL (ref 8.7–10.2)
Chloride: 104 mmol/L (ref 96–106)
Chol/HDL Ratio: 4.9 ratio (ref 0.0–5.0)
Cholesterol, Total: 168 mg/dL (ref 100–199)
Creatinine, Ser: 1.08 mg/dL (ref 0.76–1.27)
EOS (ABSOLUTE): 0.1 10*3/uL (ref 0.0–0.4)
Eos: 1 %
Estimated CHD Risk: 1 times avg. (ref 0.0–1.0)
Free Thyroxine Index: 1.5 (ref 1.2–4.9)
GGT: 14 IU/L (ref 0–65)
Globulin, Total: 2.2 g/dL (ref 1.5–4.5)
Glucose: 101 mg/dL — ABNORMAL HIGH (ref 70–99)
HDL: 34 mg/dL — ABNORMAL LOW (ref >39)
Hematocrit: 45.9 % (ref 37.5–51.0)
Hemoglobin: 15.8 g/dL (ref 13.0–17.7)
Immature Grans (Abs): 0 10*3/uL (ref 0.0–0.1)
Immature Granulocytes: 0 %
Iron: 127 ug/dL (ref 38–169)
LDH: 181 IU/L (ref 121–224)
LDL Chol Calc (NIH): 96 mg/dL (ref 0–99)
Lymphocytes Absolute: 2.9 10*3/uL (ref 0.7–3.1)
Lymphs: 42 %
MCH: 32.8 pg (ref 26.6–33.0)
MCHC: 34.4 g/dL (ref 31.5–35.7)
MCV: 95 fL (ref 79–97)
Monocytes Absolute: 0.7 10*3/uL (ref 0.1–0.9)
Monocytes: 10 %
Neutrophils Absolute: 3.2 10*3/uL (ref 1.4–7.0)
Neutrophils: 46 %
Phosphorus: 3.8 mg/dL (ref 2.8–4.1)
Platelets: 250 10*3/uL (ref 150–450)
Potassium: 4 mmol/L (ref 3.5–5.2)
RBC: 4.82 x10E6/uL (ref 4.14–5.80)
RDW: 12.2 % (ref 11.6–15.4)
Sodium: 141 mmol/L (ref 134–144)
T3 Uptake Ratio: 32 % (ref 24–39)
T4, Total: 4.6 ug/dL (ref 4.5–12.0)
TSH: 2.53 u[IU]/mL (ref 0.450–4.500)
Total Protein: 6.6 g/dL (ref 6.0–8.5)
Triglycerides: 224 mg/dL — ABNORMAL HIGH (ref 0–149)
Uric Acid: 6.4 mg/dL (ref 3.8–8.4)
VLDL Cholesterol Cal: 38 mg/dL (ref 5–40)
WBC: 7 10*3/uL (ref 3.4–10.8)
eGFR: 95 mL/min/{1.73_m2} (ref >59)

## 2021-10-22 LAB — ANTIBODY SCREEN: Antibody Screen: NEGATIVE

## 2021-10-22 LAB — ABO AND RH: Rh Factor: NEGATIVE

## 2021-10-24 ENCOUNTER — Encounter: Payer: 59 | Admitting: Physician Assistant

## 2021-11-05 ENCOUNTER — Encounter: Payer: Self-pay | Admitting: Physician Assistant

## 2021-11-05 ENCOUNTER — Ambulatory Visit: Payer: Self-pay | Admitting: Physician Assistant

## 2021-11-05 VITALS — BP 130/76 | HR 66 | Temp 97.8°F | Resp 12 | Ht 77.0 in | Wt 223.0 lb

## 2021-11-05 DIAGNOSIS — E781 Pure hyperglyceridemia: Secondary | ICD-10-CM

## 2021-11-05 DIAGNOSIS — Z0000 Encounter for general adult medical examination without abnormal findings: Secondary | ICD-10-CM

## 2021-11-05 LAB — POCT URINALYSIS DIPSTICK
Bilirubin, UA: NEGATIVE
Blood, UA: NEGATIVE
Glucose, UA: NEGATIVE
Ketones, UA: NEGATIVE
Leukocytes, UA: NEGATIVE
Nitrite, UA: NEGATIVE
Protein, UA: NEGATIVE
Spec Grav, UA: >=1.03 — AB (ref 1.010–1.025)
Urobilinogen, UA: 0.2 E.U./dL
pH, UA: 6 (ref 5.0–8.0)

## 2021-11-05 MED ORDER — ROSUVASTATIN CALCIUM 40 MG PO TABS: 40.0000 mg | ORAL_TABLET | Freq: Every day | ORAL | 3 refills | Status: AC

## 2021-11-05 NOTE — Progress Notes (Signed)
[facility] ____________________________________________   None    (approximate)  I have reviewed the triage vital signs and the nursing notes.   HISTORY  Chief Complaint Annual Exam    HPI Chou Busler is a 31 y.o. male patient presents for annual physical exam.  Patient reports concern for papular lesion superior aspect of the left shoulder.  States lesion appeared after sunburn blister.  Patient states nontender to palpation and he is scheduled to see a dermatologist for this complaint.         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 Authorizing Provider  ibuprofen (ADVIL) 800 MG tablet Take 1 tablet (800 mg total) by mouth every 8 (eight) hours as needed for moderate pain. 03/14/21  Yes Sable Feil, PA-C  naproxen (NAPROSYN) 500 MG tablet Take 1 tablet (500 mg total) by mouth 2 (two) times daily with a meal. 01/23/21  Yes Sable Feil, PA-C  valACYclovir (VALTREX) 1000 MG tablet Take 2 tablets by mouth every 12 hours for 2 doses as directed 08/12/21  Yes Sable Feil, PA-C  orphenadrine (NORFLEX) 100 MG tablet Take 1 tablet (100 mg total) by mouth 2 (two) times daily. Patient not taking: Reported on 11/05/2021 01/23/21   Sable Feil, PA-C    Allergies Patient has no known allergies.  History reviewed. No pertinent family history.  Social History Social History   Tobacco Use   Smoking status: Never   Smokeless tobacco: Never  Substance Use Topics   Alcohol use: Yes   Drug use: Never    Review of Systems Constitutional: No fever/chills Eyes: No visual  changes. ENT: No sore throat. Cardiovascular: Denies chest pain. Respiratory: Denies shortness of breath. Gastrointestinal: No abdominal pain.  No nausea, no vomiting.  No diarrhea.  No constipation. Genitourinary: Negative for dysuria. Musculoskeletal: Negative for back pain. Skin: Negative for rash.  Papular lesion left shoulder Neurological: Negative for headaches, focal weakness or numbness.  ____________________________________________   PHYSICAL EXAM:  VITAL SIGNS: Constitutional: Alert and oriented. Well appearing and in no acute distress. Eyes: Conjunctivae are normal. PERRL. EOMI. Head: Atraumatic. Nose: No congestion/rhinnorhea. Mouth/Throat: Mucous membranes are moist.  Oropharynx non-erythematous. Hematological/Lymphatic/Immunilogical: No cervical lymphadenopathy. Cardiovascular: Normal rate, regular rhythm. Grossly normal heart sounds.  Good peripheral circulation. Respiratory: Normal respiratory effort.  No retractions. Lungs CTAB. Gastrointestinal: Soft and nontender. No distention. No abdominal bruits. No CVA tenderness. Genitourinary: Deferred Musculoskeletal: No lower extremity tenderness nor edema.  No joint effusions. Neurologic:  Normal speech and language. No gross focal neurologic deficits are appreciated. No gait instability. Skin:  Skin is warm, dry and intact. No rash noted. Psychiatric: Mood and affect are normal. Speech and behavior are normal.  ____________________________________________   LABS  __     Component Ref Range & Units 2 wk ago  Antibody Screen Negative Negative   Resulting Agency  [facility]         Narrative Performed by: Maryan Puls Performed at:  [facility]  [address] [place], [place]  [id number]  Lab  Director: Rush Farmer MD, Phone:  [phone]             ABO AND RH  Order: [id number] Status: Final result    Visible to patient: Yes (not seen)    Next appt: None    Dx: Routine adult health  maintenance    0 Result Notes    Component 2 wk ago  ABO Grouping O   Rh Factor Negative   Comment: Please note: Prior records for this patient's ABO / Rh type are not  available for additional verification.   Resulting Agency [facility]         Narrative Performed by: Maryan Puls Performed at:  [facility]  [address] [place], [place]  [id number]  Lab Director: Rush Farmer MD, Phone:  [phone]             Other Results from 10/18/2021   Contains abnormal data CMP12+LP+TP+TSH+6AC+CBC/D/Plt Order: [id number] Status: Final result    Visible to patient: Yes (not seen)    Next appt: None    Dx: Routine adult health maintenance    0 Result Notes         Component Ref Range & Units 2 wk ago (10/18/21) 1 yr ago (08/03/20) 1 yr ago (12/02/19) 1 yr ago (12/02/19) 2 yr ago (09/07/19)  Glucose 70 - 99 mg/dL 101 High   96 R  101 High  CM   86 R   Uric Acid 3.8 - 8.4 mg/dL 6.4  6.9 CM    5.9 CM   Comment:            Therapeutic target for gout patients: <6.0  BUN 6 - 20 mg/dL 20  21 High   17   15   Creatinine, Ser 0.76 - 1.27 mg/dL 1.08  1.26  1.20 R   1.24   eGFR >59 mL/min/1.73 95  80      BUN/Creatinine Ratio 9 - _0 Sodium 134 - 144 mmol/L 141  142  137 R   140   Potassium 3.5 - 5.2 mmol/L 4.0  4.0  3.9 R   4.3   Chloride 96 - 106 mmol/L 104  105  103 R   102   Calcium 8.7 - 10.2 mg/dL 9.0  9.2  9.2 R   9.0   Phosphorus 2.8 - 4.1 mg/dL 3.8  3.9    2.6 Low    Total Protein 6.0 - 8.5 g/dL 6.6  6.6  7.5 R   6.9   Albumin 4.1 - 5.2 g/dL 4.4  4.6  4.6 R   4.6   Globulin, Total 1.5 - 4.5 g/dL 2.2  2.0    2.3   Albumin/Globulin Ratio 1.2 - 2.2 2.0  2.3 High     2.0   Bilirubin Total 0.0 - 1.2 mg/dL 0.2  0.7  0.9 R   0.6   Alkaline Phosphatase 44 - 121 IU/L 73  63  57 R   44 R   LDH 121 - 224 IU/L 181  151    253 High    AST 0 - 40 IU/L 43 High   23  35 R   66 High    ALT 0 - 44 IU/L 46 High   20  40 R   55 High    GGT 0 - 65 IU/L _1 Iron 38  - 169 ug/dL 127  141    141   Cholesterol, Total 100 - 199 mg/dL 168  161    133   Triglycerides 0 - 149 mg/dL 224 High   53    60   HDL >39 mg/dL 34 Low   51    27 Low    VLDL Cholesterol Cal 5 - 40 mg/dL 38  11    13   LDL Chol Calc (NIH) 0 - 99 mg/dL 96  99    93   Chol/HDL Ratio 0.0 - 5.0 ratio 4.9  3.2 CM    4.9 CM   Comment:                                   T. Chol/HDL Ratio                                              Men  Women                                1/2 Avg.Risk  3.4    3.3                                    Avg.Risk  5.0    4.4                                 2X Avg.Risk  9.6    7.1                                 3X Avg.Risk 23.4   11.0   Estimated CHD Risk 0.0 - 1.0 times avg. 1.0   < 0.5 CM    1.0 CM   Comment: The CHD Risk is based on the T. Chol/HDL ratio. Other  factors affect CHD Risk such as hypertension, smoking,  diabetes, severe obesity, and family history of  premature CHD.   TSH 0.450 - 4.500 uIU/mL 2.530  1.560    0.948   T4, Total 4.5 - 12.0 ug/dL 4.6  7.1    5.7   T3 Uptake Ratio 24 - 39 % 32  33    33   Free Thyroxine Index 1.2 - 4.9 1.5  2.3    1.9   WBC 3.4 - 10.8 x10E3/uL 7.0  6.7   7.0 R  5.7   RBC 4.14 - 5.80 x10E6/uL 4.82  4.76   4.75 R  4.81   Hemoglobin 13.0 - 17.7 g/dL 15.8  16.1   15.5 R  15.5   Hematocrit 37.5 - 51.0 % 45.9  45.3   42.6 R  45.3   MCV 79 - 97 fL 95  95   89.7 R  94   MCH 26.6 - 33.0 pg 32.8  33.8 High    32.6 R  32.2   MCHC 31.5 - 35.7 g/dL 34.4  35.5   36.4 High  R  34.2   RDW 11.6 - 15.4 % 12.2  12.3   11.6 R  11.8   Platelets 150 -  450 x10E3/uL 250  201   235 R  241   Neutrophils Not Estab. % 46  46    43   Lymphs Not Estab. % 42  42    45   Monocytes Not Estab. % _0 Eos Not Estab. % _1 Basos Not Estab. % 1  0    1   Neutrophils Absolute 1.4 - 7.0 x10E3/uL 3.2  3.0    2.5   Lymphocytes Absolute 0.7 - 3.1 x10E3/uL 2.9  2.8    2.6   Monocytes Absolute 0.1 - 0.9 x10E3/uL 0.7  0.7    0.6   EOS  (ABSOLUTE) 0.0 - 0.4 x10E3/uL 0.1  0.1    0.0   Basophils Absolute 0.0 - 0.2 x10E3/uL 0.0  0.0    0.0   Immature Granulocytes Not Estab. % 0  0    0   Immature Grans           __________________________________________     ____________________________________________    ____________________________________________   INITIAL IMPRESSION / ASSESSMENT AND PLAN  As part of my medical decision making, I reviewed the following data within the [facility]       Discussed lab results with patient showing a fourfold increase of triglycerides from last visit.  Triglycerides were originally 53 and has increased to 224.  HDL has decreased from 51 to 34.  Patient is amenable to a trial of statins for follow-up fasting lipids in 3 months.  Patient will follow-up with dermatology for papular lesion left shoulder.      ____________________________________________   FINAL CLINICAL IMPRESSION     ED Discharge Orders          Ordered    POCT urinalysis dipstick        11/05/21 1439             Note:  This document was prepared using Dragon voice recognition software and may include unintentional dictation errors.

## 2021-12-11 DIAGNOSIS — D2271 Melanocytic nevi of right lower limb, including hip: Secondary | ICD-10-CM | POA: Diagnosis not present

## 2021-12-11 DIAGNOSIS — D2261 Melanocytic nevi of right upper limb, including shoulder: Secondary | ICD-10-CM | POA: Diagnosis not present

## 2021-12-11 DIAGNOSIS — D2272 Melanocytic nevi of left lower limb, including hip: Secondary | ICD-10-CM | POA: Diagnosis not present

## 2021-12-11 DIAGNOSIS — L814 Other melanin hyperpigmentation: Secondary | ICD-10-CM | POA: Diagnosis not present

## 2021-12-11 DIAGNOSIS — D2262 Melanocytic nevi of left upper limb, including shoulder: Secondary | ICD-10-CM | POA: Diagnosis not present

## 2021-12-11 DIAGNOSIS — L980 Pyogenic granuloma: Secondary | ICD-10-CM | POA: Diagnosis not present

## 2021-12-11 DIAGNOSIS — R58 Hemorrhage, not elsewhere classified: Secondary | ICD-10-CM | POA: Diagnosis not present

## 2021-12-11 DIAGNOSIS — X32XXXA Exposure to sunlight, initial encounter: Secondary | ICD-10-CM | POA: Diagnosis not present

## 2021-12-11 DIAGNOSIS — L538 Other specified erythematous conditions: Secondary | ICD-10-CM | POA: Diagnosis not present

## 2021-12-11 DIAGNOSIS — D225 Melanocytic nevi of trunk: Secondary | ICD-10-CM | POA: Diagnosis not present

## 2022-02-04 ENCOUNTER — Other Ambulatory Visit: Payer: Self-pay

## 2022-02-04 DIAGNOSIS — E781 Pure hyperglyceridemia: Secondary | ICD-10-CM

## 2022-02-04 NOTE — Progress Notes (Signed)
Lipid panel drawn as ordered and request Testosterone level check due to new onset abnormal lipid panel and healthy diet along with body building exercise routine.

## 2022-02-06 DIAGNOSIS — M9901 Segmental and somatic dysfunction of cervical region: Secondary | ICD-10-CM | POA: Diagnosis not present

## 2022-02-06 DIAGNOSIS — M62830 Muscle spasm of back: Secondary | ICD-10-CM | POA: Diagnosis not present

## 2022-02-06 DIAGNOSIS — M9905 Segmental and somatic dysfunction of pelvic region: Secondary | ICD-10-CM | POA: Diagnosis not present

## 2022-02-06 DIAGNOSIS — M955 Acquired deformity of pelvis: Secondary | ICD-10-CM | POA: Diagnosis not present

## 2022-02-11 DIAGNOSIS — M955 Acquired deformity of pelvis: Secondary | ICD-10-CM | POA: Diagnosis not present

## 2022-02-11 DIAGNOSIS — M62830 Muscle spasm of back: Secondary | ICD-10-CM | POA: Diagnosis not present

## 2022-02-11 DIAGNOSIS — M9901 Segmental and somatic dysfunction of cervical region: Secondary | ICD-10-CM | POA: Diagnosis not present

## 2022-02-11 DIAGNOSIS — M9905 Segmental and somatic dysfunction of pelvic region: Secondary | ICD-10-CM | POA: Diagnosis not present

## 2022-02-11 LAB — LIPID PANEL
Chol/HDL Ratio: 7.8 ratio — ABNORMAL HIGH (ref 0.0–5.0)
Cholesterol, Total: 101 mg/dL (ref 100–199)
HDL: 13 mg/dL — ABNORMAL LOW (ref >39)
LDL Chol Calc (NIH): 75 mg/dL (ref 0–99)
Triglycerides: 54 mg/dL (ref 0–149)
VLDL Cholesterol Cal: 13 mg/dL (ref 5–40)

## 2022-02-11 LAB — TESTOSTERONE,FREE AND TOTAL
Testosterone, Free: 6.5 pg/mL — ABNORMAL LOW (ref 9.3–26.5)
Testosterone: 138 ng/dL — ABNORMAL LOW (ref 264–916)

## 2022-02-12 ENCOUNTER — Encounter: Payer: Self-pay | Admitting: Physician Assistant

## 2022-02-12 ENCOUNTER — Ambulatory Visit: Payer: Self-pay | Admitting: Physician Assistant

## 2022-02-12 DIAGNOSIS — E291 Testicular hypofunction: Secondary | ICD-10-CM

## 2022-02-12 NOTE — Progress Notes (Signed)
   Subjective: Lab results    Patient ID: Gerald Ward, male    DOB: 11-04-1990, 31 y.o.   MRN: [id number]  HPI Patient here to review lab results for testosterone levels.  Patient reports daily fatigue and lack of energy.  Denies erectile dysfunction or other urinary complaints.   Review of Systems Negative except for chief complaint    Objective:   Physical Exam  Vital signs not taken.     Component Ref Range & Units 8 d ago  Testosterone 264 - 916 ng/dL 138 Low    Comment: Adult male reference interval is based on a population of  healthy nonobese males (BMI <30) between 58 and 20 years old.  [place], [place] [phone]. PMID: [id number].          Testosterone, Free 9.3 - 26.5 pg/mL 6.5 Low        Assessment & Plan: Testosterone deficiency   Consult to urology for definitive evaluation and treatment.

## 2022-02-12 NOTE — Addendum Note (Signed)
Addended by: Aliene Altes on: 02/12/2022 02:01 PM   Modules accepted: Orders

## 2022-02-12 NOTE — Progress Notes (Signed)
Pt presents today for lab follow up testosterone level.

## 2022-02-13 ENCOUNTER — Ambulatory Visit: Payer: 59 | Admitting: Physician Assistant

## 2022-02-25 DIAGNOSIS — M955 Acquired deformity of pelvis: Secondary | ICD-10-CM | POA: Diagnosis not present

## 2022-02-25 DIAGNOSIS — M9901 Segmental and somatic dysfunction of cervical region: Secondary | ICD-10-CM | POA: Diagnosis not present

## 2022-02-25 DIAGNOSIS — M9905 Segmental and somatic dysfunction of pelvic region: Secondary | ICD-10-CM | POA: Diagnosis not present

## 2022-02-25 DIAGNOSIS — M62830 Muscle spasm of back: Secondary | ICD-10-CM | POA: Diagnosis not present

## 2022-02-27 ENCOUNTER — Ambulatory Visit: Payer: Self-pay | Admitting: Physician Assistant

## 2022-02-27 ENCOUNTER — Encounter: Payer: Self-pay | Admitting: Physician Assistant

## 2022-02-27 DIAGNOSIS — Z87898 Personal history of other specified conditions: Secondary | ICD-10-CM

## 2022-02-27 DIAGNOSIS — R7989 Other specified abnormal findings of blood chemistry: Secondary | ICD-10-CM

## 2022-02-27 NOTE — Progress Notes (Signed)
   Subjective: Night sweats    Patient ID: Gerald Ward, male    DOB: [birth year], 31 y.o.   MRN: [id number]  HPI Patient complain of 3 episodes of night sweats.  Patient states the patient 2 years he has had intermitting night sweats without fever or chills.  Patient states he believes onset was after returning back from [place] a few years ago.  Patient also recently have blood test revealing low testosterone levels.  Patient experiencing daily fatigue and lack of energy.  Patient denies erectile dysfunction or other urinary complaints.  Patient is scheduled to see urology near the end of November.  Review of Systems     Objective:   Physical Exam  Deferred      Assessment & Plan: Night sweats and low testosterone levels   Discussed rationale for testing for tuberculosis along with repeat testosterone levels.  Patient also will have a PSA and LH drawn in preparation for his neurology consult.

## 2022-02-27 NOTE — Addendum Note (Signed)
Addended by: Aliene Altes on: 02/27/2022 10:01 AM   Modules accepted: Orders

## 2022-03-03 LAB — QUANTIFERON-TB GOLD PLUS
QuantiFERON Mitogen Value: >10 IU/mL
QuantiFERON Nil Value: 0 IU/mL
QuantiFERON TB1 Ag Value: 0.01 IU/mL
QuantiFERON TB2 Ag Value: 0.01 IU/mL
QuantiFERON-TB Gold Plus: NEGATIVE

## 2022-03-05 LAB — TESTOSTERONE,FREE AND TOTAL
Testosterone, Free: 8.7 pg/mL — ABNORMAL LOW (ref 9.3–26.5)
Testosterone: 292 ng/dL (ref 264–916)

## 2022-03-05 LAB — LUTEINIZING HORMONE: LH: 2.4 m[IU]/mL (ref 1.7–8.6)

## 2022-03-05 LAB — PSA, TOTAL AND FREE
PSA, Free Pct: 42.5 %
PSA, Free: 0.17 ng/mL
Prostate Specific Ag, Serum: 0.4 ng/mL (ref 0.0–4.0)

## 2022-03-06 DIAGNOSIS — M9901 Segmental and somatic dysfunction of cervical region: Secondary | ICD-10-CM | POA: Diagnosis not present

## 2022-03-06 DIAGNOSIS — M62830 Muscle spasm of back: Secondary | ICD-10-CM | POA: Diagnosis not present

## 2022-03-06 DIAGNOSIS — M955 Acquired deformity of pelvis: Secondary | ICD-10-CM | POA: Diagnosis not present

## 2022-03-06 DIAGNOSIS — M9905 Segmental and somatic dysfunction of pelvic region: Secondary | ICD-10-CM | POA: Diagnosis not present

## 2022-03-07 DIAGNOSIS — M9901 Segmental and somatic dysfunction of cervical region: Secondary | ICD-10-CM | POA: Diagnosis not present

## 2022-03-07 DIAGNOSIS — M955 Acquired deformity of pelvis: Secondary | ICD-10-CM | POA: Diagnosis not present

## 2022-03-07 DIAGNOSIS — M62830 Muscle spasm of back: Secondary | ICD-10-CM | POA: Diagnosis not present

## 2022-03-07 DIAGNOSIS — M9905 Segmental and somatic dysfunction of pelvic region: Secondary | ICD-10-CM | POA: Diagnosis not present

## 2022-03-10 DIAGNOSIS — M62830 Muscle spasm of back: Secondary | ICD-10-CM | POA: Diagnosis not present

## 2022-03-10 DIAGNOSIS — M9905 Segmental and somatic dysfunction of pelvic region: Secondary | ICD-10-CM | POA: Diagnosis not present

## 2022-03-10 DIAGNOSIS — M9901 Segmental and somatic dysfunction of cervical region: Secondary | ICD-10-CM | POA: Diagnosis not present

## 2022-03-10 DIAGNOSIS — M955 Acquired deformity of pelvis: Secondary | ICD-10-CM | POA: Diagnosis not present

## 2022-03-12 ENCOUNTER — Other Ambulatory Visit: Payer: Self-pay

## 2022-03-12 ENCOUNTER — Ambulatory Visit (INDEPENDENT_AMBULATORY_CARE_PROVIDER_SITE_OTHER): Payer: 59 | Admitting: Urology

## 2022-03-12 ENCOUNTER — Encounter: Payer: Self-pay | Admitting: Urology

## 2022-03-12 VITALS — BP 122/76 | HR 54 | Ht 77.0 in | Wt 225.0 lb

## 2022-03-12 DIAGNOSIS — B009 Herpesviral infection, unspecified: Secondary | ICD-10-CM

## 2022-03-12 DIAGNOSIS — E291 Testicular hypofunction: Secondary | ICD-10-CM

## 2022-03-12 DIAGNOSIS — M955 Acquired deformity of pelvis: Secondary | ICD-10-CM | POA: Diagnosis not present

## 2022-03-12 DIAGNOSIS — M9905 Segmental and somatic dysfunction of pelvic region: Secondary | ICD-10-CM | POA: Diagnosis not present

## 2022-03-12 DIAGNOSIS — M9901 Segmental and somatic dysfunction of cervical region: Secondary | ICD-10-CM | POA: Diagnosis not present

## 2022-03-12 DIAGNOSIS — M62830 Muscle spasm of back: Secondary | ICD-10-CM | POA: Diagnosis not present

## 2022-03-12 MED ORDER — VALACYCLOVIR HCL 1 G PO TABS: ORAL_TABLET | ORAL | 1 refills | Status: DC

## 2022-03-12 NOTE — Progress Notes (Signed)
   03/12/22 1:07 PM   Jaythen Jeri Lager [birth year] [id number]  CC: Low testosterone  HPI: 31 year old male who reports about 6 months of fatigue as well as some intermittent night sweats a few times a month with significant decreased energy.  He had a morning testosterone checked x2 which was low at 138, and repeat remained low at 292.  He denies any problems with libido or erections.  LH was low normal at 2.4, PSA normal at 0.4, urinalysis benign, normal BMP.  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  He does have problems sleeping.  Denies any illicit drugs or exogenous steroid use.  He has a 22 year old biologic child from a prior relationship.  He and his current partner are interested in future pregnancies.   PMH: Past Medical History:  Diagnosis Date   Elbow pain     [facility] (paper chart)   Hx of tonsillectomy     Surgical History: Past Surgical History:  Procedure Laterality Date   TONSILLECTOMY     TONSILLECTOMY AND ADENOIDECTOMY      Social History:  reports that he has never smoked. He has never been exposed to tobacco smoke. He has never used smokeless tobacco. He reports current alcohol use. He reports that he does not use drugs.  Physical Exam: BP 122/76   Pulse (!) 54   Ht 6\' 5"  (1.956 m)   Wt 225 lb (102.1 kg)   BMI 26.68 kg/m    Constitutional:  Alert and oriented, No acute distress. Cardiovascular: No clubbing, cyanosis, or edema. Respiratory: Normal respiratory effort, no increased work of breathing. GI: Abdomen is soft, nontender, nondistended, no abdominal masses GU: circumcised phallus with patent meatus, no lesions, testicles 20 cc and descended bilaterally without masses  Laboratory Data: Testosterone 138, repeat 292 LH normal 2.4 PSA 0.4 Hematocrit 45.9 TSH normal 2.5   Assessment & Plan:   31 year old male with 2 low testosterone levels of 138 and 292, symptoms of fatigue, decreased energy, night sweats.  Physical exam  normal today.  We reviewed the AUA guidelines regarding evaluation, next steps include prolactin, FSH, estradiol, and they requested B12 lab as well.  We discussed potential need for endocrinology referral pending these results.  We also reviewed possible other options like Clomid if further evaluation with no obvious etiology of low testosterone.  We reviewed impact of exogenous testosterone on fertility, and I discouraged him from considering exogenous testosterone at this time.  Prolactin, FSH, estradiol, B12-call with results  Nickolas Madrid, MD 03/12/2022  [facility] [address], [place], [place] [address] [phone]

## 2022-03-12 NOTE — Progress Notes (Signed)
Pt presents today to compete lab order per Aaron Edelman Sninsky,MD

## 2022-03-12 NOTE — Patient Instructions (Signed)

## 2022-03-13 LAB — FSH/LH
FSH: 2.1 m[IU]/mL (ref 1.5–12.4)
LH: 2.9 m[IU]/mL (ref 1.7–8.6)

## 2022-03-13 LAB — CBC WITH DIFFERENTIAL/PLATELET
Basophils Absolute: 0.1 10*3/uL (ref 0.0–0.2)
Basos: 1 %
EOS (ABSOLUTE): 0 10*3/uL (ref 0.0–0.4)
Eos: 0 %
Hematocrit: 46 % (ref 37.5–51.0)
Hemoglobin: 16.1 g/dL (ref 13.0–17.7)
Immature Grans (Abs): 0 10*3/uL (ref 0.0–0.1)
Immature Granulocytes: 0 %
Lymphocytes Absolute: 3.8 10*3/uL — ABNORMAL HIGH (ref 0.7–3.1)
Lymphs: 41 %
MCH: 32.9 pg (ref 26.6–33.0)
MCHC: 35 g/dL (ref 31.5–35.7)
MCV: 94 fL (ref 79–97)
Monocytes Absolute: 0.8 10*3/uL (ref 0.1–0.9)
Monocytes: 9 %
Neutrophils Absolute: 4.6 10*3/uL (ref 1.4–7.0)
Neutrophils: 49 %
Platelets: 217 10*3/uL (ref 150–450)
RBC: 4.9 x10E6/uL (ref 4.14–5.80)
RDW: 12 % (ref 11.6–15.4)
WBC: 9.3 10*3/uL (ref 3.4–10.8)

## 2022-03-13 LAB — VITAMIN B12: Vitamin B-12: 682 pg/mL (ref 232–1245)

## 2022-03-13 LAB — PROLACTIN: Prolactin: 7.3 ng/mL (ref 4.0–15.2)

## 2022-03-13 LAB — ESTRADIOL: Estradiol: 8.2 pg/mL (ref 7.6–42.6)

## 2022-03-14 ENCOUNTER — Telehealth: Payer: Self-pay | Admitting: *Deleted

## 2022-03-14 DIAGNOSIS — E291 Testicular hypofunction: Secondary | ICD-10-CM

## 2022-03-14 NOTE — Telephone Encounter (Signed)
Pt calling stating that his lab results are back and wanted to know what Dr. Diamantina Providence wanted to do?   Advised pt that Dr. Diamantina Providence was out of office and we will call with results on Monday.

## 2022-03-18 ENCOUNTER — Encounter: Payer: Self-pay | Admitting: *Deleted

## 2022-03-18 NOTE — Telephone Encounter (Signed)
All labs are normal, including B12.  I would recommend repeating morning LH, FSH, and testosterone in 8 weeks(ok to give lab slip to take to work lab), since the testosterone is on an upward trend from 130 to 290 before starting any medications.  If testosterone is still low at that time we can consider starting the Clomid as we discussed previously   Nickolas Madrid, MD  03/18/2022

## 2022-03-18 NOTE — Telephone Encounter (Signed)
Spoke with patient and advised results Pt will get labs thru his job, they will have to order. Also sent to his mychart

## 2022-03-25 DIAGNOSIS — M62830 Muscle spasm of back: Secondary | ICD-10-CM | POA: Diagnosis not present

## 2022-03-25 DIAGNOSIS — M955 Acquired deformity of pelvis: Secondary | ICD-10-CM | POA: Diagnosis not present

## 2022-03-25 DIAGNOSIS — M9901 Segmental and somatic dysfunction of cervical region: Secondary | ICD-10-CM | POA: Diagnosis not present

## 2022-03-25 DIAGNOSIS — M9905 Segmental and somatic dysfunction of pelvic region: Secondary | ICD-10-CM | POA: Diagnosis not present

## 2022-04-14 DIAGNOSIS — M9905 Segmental and somatic dysfunction of pelvic region: Secondary | ICD-10-CM | POA: Diagnosis not present

## 2022-04-14 DIAGNOSIS — M9901 Segmental and somatic dysfunction of cervical region: Secondary | ICD-10-CM | POA: Diagnosis not present

## 2022-04-14 DIAGNOSIS — M955 Acquired deformity of pelvis: Secondary | ICD-10-CM | POA: Diagnosis not present

## 2022-04-14 DIAGNOSIS — M62830 Muscle spasm of back: Secondary | ICD-10-CM | POA: Diagnosis not present

## 2022-05-07 DIAGNOSIS — M955 Acquired deformity of pelvis: Secondary | ICD-10-CM | POA: Diagnosis not present

## 2022-05-07 DIAGNOSIS — M9905 Segmental and somatic dysfunction of pelvic region: Secondary | ICD-10-CM | POA: Diagnosis not present

## 2022-05-07 DIAGNOSIS — F4322 Adjustment disorder with anxiety: Secondary | ICD-10-CM | POA: Diagnosis not present

## 2022-05-07 DIAGNOSIS — M62830 Muscle spasm of back: Secondary | ICD-10-CM | POA: Diagnosis not present

## 2022-05-07 DIAGNOSIS — M9901 Segmental and somatic dysfunction of cervical region: Secondary | ICD-10-CM | POA: Diagnosis not present

## 2022-06-05 DIAGNOSIS — F4322 Adjustment disorder with anxiety: Secondary | ICD-10-CM | POA: Diagnosis not present

## 2022-08-11 DIAGNOSIS — F4322 Adjustment disorder with anxiety: Secondary | ICD-10-CM | POA: Diagnosis not present

## 2022-08-19 DIAGNOSIS — F4322 Adjustment disorder with anxiety: Secondary | ICD-10-CM | POA: Diagnosis not present

## 2022-08-29 DIAGNOSIS — F4322 Adjustment disorder with anxiety: Secondary | ICD-10-CM | POA: Diagnosis not present

## 2022-09-02 ENCOUNTER — Ambulatory Visit: Payer: Self-pay

## 2022-09-02 DIAGNOSIS — Z0000 Encounter for general adult medical examination without abnormal findings: Secondary | ICD-10-CM

## 2022-09-02 DIAGNOSIS — F4322 Adjustment disorder with anxiety: Secondary | ICD-10-CM | POA: Diagnosis not present

## 2022-09-02 NOTE — Progress Notes (Signed)
Pre physical labs drawn fasting.

## 2022-09-03 LAB — CMP12+LP+TP+TSH+6AC+CBC/D/PLT
ALT: 27 IU/L (ref 0–44)
AST: 25 IU/L (ref 0–40)
Albumin/Globulin Ratio: 2.1 (ref 1.2–2.2)
Albumin: 4.7 g/dL (ref 4.3–5.2)
Alkaline Phosphatase: 74 IU/L (ref 44–121)
BUN/Creatinine Ratio: 15 (ref 9–20)
BUN: 18 mg/dL (ref 6–20)
Basophils Absolute: 0 10*3/uL (ref 0.0–0.2)
Basos: 0 %
Bilirubin Total: 1 mg/dL (ref 0.0–1.2)
Calcium: 9.6 mg/dL (ref 8.7–10.2)
Chloride: 101 mmol/L (ref 96–106)
Chol/HDL Ratio: 3.1 ratio (ref 0.0–5.0)
Cholesterol, Total: 182 mg/dL (ref 100–199)
Creatinine, Ser: 1.2 mg/dL (ref 0.76–1.27)
EOS (ABSOLUTE): 0 10*3/uL (ref 0.0–0.4)
Eos: 0 %
Estimated CHD Risk: <0.5 times avg. (ref 0.0–1.0)
Free Thyroxine Index: 1.6 (ref 1.2–4.9)
GGT: 21 IU/L (ref 0–65)
Globulin, Total: 2.2 g/dL (ref 1.5–4.5)
Glucose: 95 mg/dL (ref 70–99)
HDL: 59 mg/dL (ref >39)
Hematocrit: 44.8 % (ref 37.5–51.0)
Hemoglobin: 15.7 g/dL (ref 13.0–17.7)
Immature Grans (Abs): 0 10*3/uL (ref 0.0–0.1)
Immature Granulocytes: 0 %
Iron: 245 ug/dL — ABNORMAL HIGH (ref 38–169)
LDH: 170 IU/L (ref 121–224)
LDL Chol Calc (NIH): 110 mg/dL — ABNORMAL HIGH (ref 0–99)
Lymphocytes Absolute: 3.3 10*3/uL — ABNORMAL HIGH (ref 0.7–3.1)
Lymphs: 48 %
MCH: 32.3 pg (ref 26.6–33.0)
MCHC: 35 g/dL (ref 31.5–35.7)
MCV: 92 fL (ref 79–97)
Monocytes Absolute: 0.6 10*3/uL (ref 0.1–0.9)
Monocytes: 8 %
Neutrophils Absolute: 3.1 10*3/uL (ref 1.4–7.0)
Neutrophils: 44 %
Phosphorus: 2.9 mg/dL (ref 2.8–4.1)
Platelets: 217 10*3/uL (ref 150–450)
Potassium: 4.4 mmol/L (ref 3.5–5.2)
RBC: 4.86 x10E6/uL (ref 4.14–5.80)
RDW: 11.9 % (ref 11.6–15.4)
Sodium: 140 mmol/L (ref 134–144)
T3 Uptake Ratio: 28 % (ref 24–39)
T4, Total: 5.8 ug/dL (ref 4.5–12.0)
TSH: 1.12 u[IU]/mL (ref 0.450–4.500)
Total Protein: 6.9 g/dL (ref 6.0–8.5)
Triglycerides: 67 mg/dL (ref 0–149)
Uric Acid: 6.8 mg/dL (ref 3.8–8.4)
VLDL Cholesterol Cal: 13 mg/dL (ref 5–40)
WBC: 7 10*3/uL (ref 3.4–10.8)
eGFR: 84 mL/min/{1.73_m2} (ref >59)

## 2022-09-08 ENCOUNTER — Encounter: Payer: Self-pay | Admitting: Physician Assistant

## 2022-09-08 ENCOUNTER — Ambulatory Visit: Payer: Self-pay | Admitting: Physician Assistant

## 2022-09-08 VITALS — BP 132/79 | HR 54 | Temp 98.0°F | Resp 12 | Ht 77.0 in | Wt 218.0 lb

## 2022-09-08 DIAGNOSIS — F4322 Adjustment disorder with anxiety: Secondary | ICD-10-CM | POA: Diagnosis not present

## 2022-09-08 DIAGNOSIS — Z0000 Encounter for general adult medical examination without abnormal findings: Secondary | ICD-10-CM

## 2022-09-08 LAB — POCT URINALYSIS DIPSTICK
Bilirubin, UA: NEGATIVE
Blood, UA: NEGATIVE
Glucose, UA: NEGATIVE
Ketones, UA: NEGATIVE
Leukocytes, UA: NEGATIVE
Nitrite, UA: NEGATIVE
Protein, UA: NEGATIVE
Spec Grav, UA: 1.015 (ref 1.010–1.025)
Urobilinogen, UA: 0.2 E.U./dL
pH, UA: 6.5 (ref 5.0–8.0)

## 2022-09-08 NOTE — Progress Notes (Signed)
Stopped taking Crestor last year after he came back to clinic for follow-up labwork.

## 2022-09-08 NOTE — Progress Notes (Signed)
[facility] ____________________________________________   None    (approximate)  I have reviewed the triage vital signs and the nursing notes.   HISTORY  Chief Complaint Annual Exam   HPI Gerald Ward is a 32 y.o. male patient presents for annual physical exam.  States no concerns or complaints.         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 Authorizing Provider  rosuvastatin (CRESTOR) 40 MG tablet Take 1 tablet (40 mg total) by mouth daily. 11/05/21   Joni Reining, PA-C  valACYclovir (VALTREX) 1000 MG tablet Take 2 tablets by mouth every 12 hours for 2 doses as directed 03/12/22   Joni Reining, PA-C    Allergies Patient has no known allergies.  No family history on file.  Social History Social History   Tobacco Use   Smoking status: Never    Passive exposure: Never   Smokeless tobacco: Never  Substance Use Topics   Alcohol use: Yes   Drug use: Never    Review of Systems Constitutional: No fever/chills Eyes: No visual changes. ENT: No sore throat. Cardiovascular: Denies chest pain. Respiratory: Denies shortness of breath. Gastrointestinal: No abdominal pain.  No nausea, no vomiting.  No diarrhea.  No constipation. Genitourinary: Negative for dysuria. Musculoskeletal: Negative for back pain. Skin: Negative for rash. Neurological: Negative for headaches, focal weakness or numbness.  ____________________________________________   PHYSICAL EXAM:  VITAL SIGNS: BP 132/79  BP Location Left Arm  Patient Position Sitting  Cuff Size Large  Pulse 54  Resp 12   Temp 98 F (36.7 C)  Temp src Temporal  SpO2 99 %  Weight 218 lb (98.9 kg)  Height  (1.956 m)   BMI 25.85 kg/m2  BSA 2.32 m2   Constitutional: Alert and oriented. Well appearing and in no acute distress. Eyes: Conjunctivae are normal. PERRL. EOMI. Head: Atraumatic. Nose: No congestion/rhinnorhea. Mouth/Throat: Mucous membranes are moist.  Oropharynx non-erythematous. Neck: No stridor.  No cervical spine tenderness to palpation. Hematological/Lymphatic/Immunilogical: No cervical lymphadenopathy. Cardiovascular: Normal rate, regular rhythm. Grossly normal heart sounds.  Good peripheral circulation. Respiratory: Normal respiratory effort.  No retractions. Lungs CTAB. Gastrointestinal: Soft and nontender. No distention. No abdominal bruits. No CVA tenderness. Genitourinary: Deferred Musculoskeletal: No lower extremity tenderness nor edema.  No joint effusions. Neurologic:  Normal speech and language. No gross focal neurologic deficits are appreciated. No gait instability. Skin:  Skin is warm, dry and intact. No rash noted. Psychiatric: Mood and affect are normal. Speech and behavior are normal.  ____________________________________________   LABS           Component Ref Range & Units 6 d ago (09/02/22) 6 mo ago (03/12/22) 7 mo ago (02/04/22) 10 mo ago (10/18/21) 2 yr ago (08/03/20) 2 yr ago (12/02/19) 2 yr ago (12/02/19)  Glucose 70 - 99 mg/dL 95   960 High  96 R 454 High  CM   Uric Acid 3.8 - 8.4 mg/dL 6.8   6.4 CM 6.9 CM    Comment:  Therapeutic target for gout patients: <6.0  BUN 6 - 20 mg/dL 18   20 21  High  17   Creatinine, Ser 0.76 - 1.27 mg/dL 1.61   0.96 0.45 4.09 R   eGFR >59 mL/min/1.73 84   95 80    BUN/Creatinine Ratio 9 - 20 15   19 17     Sodium 134 - 144 mmol/L 140   141 142 137 R   Potassium 3.5 - 5.2 mmol/L 4.4   4.0 4.0 3.9 R   Chloride 96 - 106 mmol/L 101   104 105 103 R   Calcium 8.7 - 10.2 mg/dL 9.6   9.0 9.2 9.2 R    Phosphorus 2.8 - 4.1 mg/dL 2.9   3.8 3.9    Total Protein 6.0 - 8.5 g/dL 6.9   6.6 6.6 7.5 R   Albumin 4.3 - 5.2 g/dL 4.7   4.4 R 4.6 R 4.6 R   Globulin, Total 1.5 - 4.5 g/dL 2.2   2.2 2.0    Albumin/Globulin Ratio 1.2 - 2.2 2.1   2.0 2.3 High     Bilirubin Total 0.0 - 1.2 mg/dL 1.0   0.2 0.7 0.9 R   Alkaline Phosphatase 44 - 121 IU/L 74   73 63 57 R   LDH 121 - 224 IU/L 170   181 151    AST 0 - 40 IU/L 25   43 High  23 35 R   ALT 0 - 44 IU/L 27   46 High  20 40 R   GGT 0 - 65 IU/L 21   14 20     Iron 38 - 169 ug/dL 811 High    914 782    Cholesterol, Total 100 - 199 mg/dL 956  213 086 578    Triglycerides 0 - 149 mg/dL 67  54 469 High  53    HDL >39 mg/dL 59  13 Low  34 Low  51    VLDL Cholesterol Cal 5 - 40 mg/dL 13  13 38 11    LDL Chol Calc (NIH) 0 - 99 mg/dL 629 High   75 96 99    Chol/HDL Ratio 0.0 - 5.0 ratio 3.1  7.8 High  CM 4.9 CM 3.2 CM    Comment:                                   T. Chol/HDL Ratio                                             Men  Women                               1/2 Avg.Risk  3.4    3.3                                   Avg.Risk  5.0    4.4                                2X Avg.Risk  9.6    7.1  3X Avg.Risk 23.4   11.0  Estimated CHD Risk 0.0 - 1.0 times avg.  < 0.5   1.0 CM  < 0.5 CM    Comment: The CHD Risk is based on the T. Chol/HDL ratio. Other factors affect CHD Risk such as hypertension, smoking, diabetes, severe obesity, and family history of premature CHD.  TSH 0.450 - 4.500 uIU/mL 1.120   2.530 1.560    T4, Total 4.5 - 12.0 ug/dL 5.8   4.6 7.1    T3 Uptake Ratio 24 - 39 % 28   32 33    Free Thyroxine Index 1.2 - 4.9 1.6   1.5 2.3    WBC 3.4 - 10.8 x10E3/uL 7.0 9.3  7.0 6.7  7.0 R  RBC 4.14 - 5.80 x10E6/uL 4.86 4.90  4.82 4.76  4.75 R  Hemoglobin 13.0 - 17.7 g/dL 09.8 11.9  14.7 82.9  56.2 R  Hematocrit 37.5 - 51.0 % 44.8 46.0  45.9 45.3  42.6 R  MCV 79 - 97 fL 92 94  95 95  89.7 R   MCH 26.6 - 33.0 pg 32.3 32.9  32.8 33.8 High   32.6 R  MCHC 31.5 - 35.7 g/dL 13.0 86.5  78.4 69.6  29.5 High  R  RDW 11.6 - 15.4 % 11.9 12.0  12.2 12.3  11.6 R  Platelets 150 - 450 x10E3/uL 217 217  250 201  235 R  Neutrophils Not Estab. % 44 49  46 46    Lymphs Not Estab. % 48 41  42 42    Monocytes Not Estab. % Eos Not Estab. % 0 0  1 1    Basos Not Estab. % 0 1  1 0    Neutrophils Absolute 1.4 - 7.0 x10E3/uL 3.1 4.6  3.2 3.0    Lymphocytes Absolute 0.7 - 3.1 x10E3/uL 3.3 High  3.8 High   2.9 2.8    Monocytes Absolute 0.1 - 0.9 x10E3/uL 0.6 0.8  0.7 0.7    EOS (ABSOLUTE) 0.0 - 0.4 x10E3/uL 0.0 0.0  0.1 0.1    Basophils Absolute 0.0 - 0.2 x10E3/uL 0.0 0.1  0.0 0.0    Immature Granulocytes Not Estab. % 0 0  0 0    Immature Grans (Abs)             ____________________________________________     ____________________________________________   INITIAL IMPRESSION / ASSESSMENT AND PLAN  As part of my medical decision making, I reviewed the following data within the electronic MEDICAL RECORD NUMBER       No acute findings on physical exam.  Advised patient to decrease his iron intakes.     ____________________________________________   FINAL CLINICAL IMPRESSION  Well exam  ED Discharge Orders     None        Note:  This document was prepared using Dragon voice recognition software and may include unintentional dictation errors.

## 2022-09-15 DIAGNOSIS — M955 Acquired deformity of pelvis: Secondary | ICD-10-CM | POA: Diagnosis not present

## 2022-09-15 DIAGNOSIS — M62830 Muscle spasm of back: Secondary | ICD-10-CM | POA: Diagnosis not present

## 2022-09-15 DIAGNOSIS — M9905 Segmental and somatic dysfunction of pelvic region: Secondary | ICD-10-CM | POA: Diagnosis not present

## 2022-09-15 DIAGNOSIS — M9901 Segmental and somatic dysfunction of cervical region: Secondary | ICD-10-CM | POA: Diagnosis not present

## 2022-09-16 DIAGNOSIS — M955 Acquired deformity of pelvis: Secondary | ICD-10-CM | POA: Diagnosis not present

## 2022-09-16 DIAGNOSIS — M9905 Segmental and somatic dysfunction of pelvic region: Secondary | ICD-10-CM | POA: Diagnosis not present

## 2022-09-16 DIAGNOSIS — M62830 Muscle spasm of back: Secondary | ICD-10-CM | POA: Diagnosis not present

## 2022-09-16 DIAGNOSIS — M9901 Segmental and somatic dysfunction of cervical region: Secondary | ICD-10-CM | POA: Diagnosis not present

## 2022-09-17 DIAGNOSIS — M62830 Muscle spasm of back: Secondary | ICD-10-CM | POA: Diagnosis not present

## 2022-09-17 DIAGNOSIS — M9901 Segmental and somatic dysfunction of cervical region: Secondary | ICD-10-CM | POA: Diagnosis not present

## 2022-09-17 DIAGNOSIS — M955 Acquired deformity of pelvis: Secondary | ICD-10-CM | POA: Diagnosis not present

## 2022-09-17 DIAGNOSIS — M9905 Segmental and somatic dysfunction of pelvic region: Secondary | ICD-10-CM | POA: Diagnosis not present

## 2022-11-06 ENCOUNTER — Other Ambulatory Visit: Payer: Self-pay

## 2022-11-06 DIAGNOSIS — B009 Herpesviral infection, unspecified: Secondary | ICD-10-CM

## 2022-11-06 MED ORDER — VALACYCLOVIR HCL 1 G PO TABS: ORAL_TABLET | ORAL | 1 refills | Status: DC

## 2023-08-26 ENCOUNTER — Ambulatory Visit: Payer: Self-pay

## 2023-08-26 DIAGNOSIS — Z0000 Encounter for general adult medical examination without abnormal findings: Secondary | ICD-10-CM

## 2023-08-26 LAB — POCT URINALYSIS DIPSTICK
Bilirubin, UA: NEGATIVE
Blood, UA: NEGATIVE
Glucose, UA: NEGATIVE
Ketones, UA: NEGATIVE
Leukocytes, UA: NEGATIVE
Nitrite, UA: NEGATIVE
Protein, UA: POSITIVE — AB
Spec Grav, UA: 1.02 (ref 1.010–1.025)
Urobilinogen, UA: 0.2 U/dL
pH, UA: 6 (ref 5.0–8.0)

## 2023-08-28 LAB — CMP12+LP+TP+TSH+6AC+CBC/D/PLT
ALT: 26 IU/L (ref 0–44)
AST: 34 IU/L (ref 0–40)
Albumin: 5 g/dL (ref 4.3–5.2)
Alkaline Phosphatase: 75 IU/L (ref 44–121)
BUN/Creatinine Ratio: 13 (ref 9–20)
BUN: 16 mg/dL (ref 6–20)
Basophils Absolute: 0 10*3/uL (ref 0.0–0.2)
Basos: 1 %
Bilirubin Total: 0.8 mg/dL (ref 0.0–1.2)
Calcium: 9.7 mg/dL (ref 8.7–10.2)
Chloride: 102 mmol/L (ref 96–106)
Chol/HDL Ratio: 3.6 ratio (ref 0.0–5.0)
Cholesterol, Total: 174 mg/dL (ref 100–199)
Creatinine, Ser: 1.25 mg/dL (ref 0.76–1.27)
EOS (ABSOLUTE): 0 10*3/uL (ref 0.0–0.4)
Eos: 1 %
Estimated CHD Risk: 0.6 times avg. (ref 0.0–1.0)
Free Thyroxine Index: 2 (ref 1.2–4.9)
GGT: 15 IU/L (ref 0–65)
Globulin, Total: 2 g/dL (ref 1.5–4.5)
Glucose: 92 mg/dL (ref 70–99)
HDL: 49 mg/dL (ref >39)
Hematocrit: 49.3 % (ref 37.5–51.0)
Hemoglobin: 16.7 g/dL (ref 13.0–17.7)
Immature Grans (Abs): 0 10*3/uL (ref 0.0–0.1)
Immature Granulocytes: 0 %
Iron: 269 ug/dL (ref 38–169)
LDH: 176 IU/L (ref 121–224)
LDL Chol Calc (NIH): 109 mg/dL — ABNORMAL HIGH (ref 0–99)
Lymphocytes Absolute: 2.9 10*3/uL (ref 0.7–3.1)
Lymphs: 42 %
MCH: 32.6 pg (ref 26.6–33.0)
MCHC: 33.9 g/dL (ref 31.5–35.7)
MCV: 96 fL (ref 79–97)
Monocytes Absolute: 0.8 10*3/uL (ref 0.1–0.9)
Monocytes: 11 %
Neutrophils Absolute: 3.2 10*3/uL (ref 1.4–7.0)
Neutrophils: 45 %
Phosphorus: 3 mg/dL (ref 2.8–4.1)
Platelets: 232 10*3/uL (ref 150–450)
Potassium: 4.4 mmol/L (ref 3.5–5.2)
RBC: 5.12 x10E6/uL (ref 4.14–5.80)
RDW: 12.4 % (ref 11.6–15.4)
Sodium: 142 mmol/L (ref 134–144)
T3 Uptake Ratio: 31 % (ref 24–39)
T4, Total: 6.3 ug/dL (ref 4.5–12.0)
TSH: 1.09 u[IU]/mL (ref 0.450–4.500)
Total Protein: 7 g/dL (ref 6.0–8.5)
Triglycerides: 85 mg/dL (ref 0–149)
Uric Acid: 5.5 mg/dL (ref 3.8–8.4)
VLDL Cholesterol Cal: 16 mg/dL (ref 5–40)
WBC: 7 10*3/uL (ref 3.4–10.8)
eGFR: 79 mL/min/{1.73_m2} (ref >59)

## 2023-09-07 ENCOUNTER — Encounter: Admitting: Physician Assistant

## 2023-09-28 ENCOUNTER — Encounter: Payer: Self-pay | Admitting: Physician Assistant

## 2023-09-28 ENCOUNTER — Ambulatory Visit: Admitting: Physician Assistant

## 2023-09-28 VITALS — BP 130/77 | HR 62 | Temp 98.0°F | Resp 14 | Wt 225.0 lb

## 2023-09-28 DIAGNOSIS — M9901 Segmental and somatic dysfunction of cervical region: Secondary | ICD-10-CM | POA: Diagnosis not present

## 2023-09-28 DIAGNOSIS — D509 Iron deficiency anemia, unspecified: Secondary | ICD-10-CM

## 2023-09-28 DIAGNOSIS — M62830 Muscle spasm of back: Secondary | ICD-10-CM | POA: Diagnosis not present

## 2023-09-28 DIAGNOSIS — M9905 Segmental and somatic dysfunction of pelvic region: Secondary | ICD-10-CM | POA: Diagnosis not present

## 2023-09-28 DIAGNOSIS — M955 Acquired deformity of pelvis: Secondary | ICD-10-CM | POA: Diagnosis not present

## 2023-09-28 DIAGNOSIS — Z0000 Encounter for general adult medical examination without abnormal findings: Secondary | ICD-10-CM

## 2023-09-28 NOTE — Progress Notes (Signed)
 [facility]   ____________________________________________   None    (approximate)  I have reviewed the triage vital signs and the nursing notes.   HISTORY  Chief Complaint No chief complaint on file.    HPI Gerald Ward is a 33 y.o. male presents for annual physical exam.  Patient was no concerning complaints.  It is noted the patient iron levels are 269.        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 Authorizing Provider  rosuvastatin  (CRESTOR ) 40 MG tablet Take 1 tablet (40 mg total) by mouth daily. Patient not taking: Reported on 09/28/2023 11/05/21   Marcina Severe, PA-C  valACYclovir  (VALTREX ) 1000 MG tablet Take 2 tablets by mouth every 12 hours for 2 doses as directed Patient not taking: Reported on 09/28/2023 11/06/22   Stafford Eagles, PA-C    Allergies Patient has no known allergies.  No family history on file.  Social History Social History   Tobacco Use   Smoking status: Never    Passive exposure: Never   Smokeless tobacco: Never  Substance Use Topics   Alcohol use: Yes   Drug use: Never    Review of Systems Constitutional: No fever/chills Eyes: No visual changes. ENT: No sore throat. Cardiovascular: Denies chest pain. Respiratory: Denies shortness of breath. Gastrointestinal: No abdominal pain.  No nausea, no vomiting.  No diarrhea.  No constipation. Genitourinary: Negative for dysuria. Musculoskeletal: Negative for back pain. Skin: Negative for rash.  Recurrent cold sores ____________________________________________   PHYSICAL EXAM:  VITAL  SIGNS: BP 130/77  Pulse Rate 62  Temp 98 F (36.7 C)  Weight 225 lb (102.1 kg)  Resp 14  SpO2 96 %   Constitutional: Alert and oriented. Well appearing and in no acute distress. Eyes: Conjunctivae are normal. PERRL. EOMI. Head: Atraumatic. Nose: No congestion/rhinnorhea. Mouth/Throat: Mucous membranes are moist.  Oropharynx non-erythematous. Neck: No stridor.  No cervical spine tenderness to palpation. Hematological/Lymphatic/Immunilogical: No cervical lymphadenopathy. Cardiovascular: Normal rate, regular rhythm. Grossly normal heart sounds.  Good peripheral circulation. Respiratory: Normal respiratory effort.  No retractions. Lungs CTAB. Gastrointestinal: Soft and nontender. No distention. No abdominal bruits. No CVA tenderness. Genitourinary: Deferred Musculoskeletal: No lower extremity tenderness nor edema.  No joint effusions. Neurologic:  Normal speech and language. No gross focal neurologic deficits are appreciated. No gait instability. Skin:  Skin is warm, dry and intact. No rash noted. Psychiatric: Mood and affect are normal. Speech and behavior are normal.  ____________________________________________   LABS         Component Ref Range & Units (hover) 1 mo ago (08/26/23) 1 yr ago (09/08/22) 1 yr ago (11/05/21) 3 yr ago (12/02/19) 4 yr ago (09/07/19)  Color, UA Amber yellow Yellow  Dark Yellow  Clarity, UA Clear clear Clear  Clear  Glucose, UA Negative Negative Negative  Negative  Bilirubin, UA Negative neg Negative  Negative  Ketones, UA Negative neg Negative  Negative  Spec Grav, UA 1.020 1.015 >=1.030 Abnormal   1.025  Blood, UA Negative neg Negative  Negative  pH, UA 6.0 6.5 6.0  6.0  Protein, UA Positive Abnormal  Negative Negative  Positive Abnormal  CM  Comment: 1+  Urobilinogen, UA 0.2 0.2 0.2  0.2  Nitrite, UA Negative neg Negative  Negative  Leukocytes, UA Negative Negative Negative  Negative  Appearance    CLEAR Abnormal  R   Odor       Resulting  Agency    [facility]         Specimen Collected: 08/26/23 16:13 Last Resulted: 08/26/23 16:13      Lab Flowsheet      Order Details      View Encounter      Lab and Collection Details      Routing      Result History    View All Conversations on this Encounter    CM=Additional comments  R=Reference range differs from most recent result in table    Result Care Coordination   Patient Communication   Add Comments   Seen Back to Top   Other Results from 08/26/2023   Contains critical data Executive Panel Order: [id number]  Status: Final result     Next appt: None     Dx: Routine adult health maintenance     Test Result Released: Yes (seen)   0 Result Notes            Component Ref Range & Units (hover) 1 mo ago (08/26/23) 1 yr ago (09/02/22) 1 yr ago (03/12/22) 1 yr ago (02/04/22) 1 yr ago (10/18/21) 3 yr ago (08/03/20) 3 yr ago (12/02/19) 3 yr ago (12/02/19)  Glucose 92 95   101 High  96 R 101 High  CM   Uric Acid 5.5 6.8 CM   6.4 CM 6.9 CM    Comment:            Therapeutic target for gout patients: <6.0  BUN 16 18   20 21  High  17   Creatinine, Ser 1.25 1.20   1.08 1.26 1.20 R   eGFR 79 84   95 80    BUN/Creatinine Ratio 13 15   19 17     Sodium 142 140   141 142 137 R   Potassium 4.4 4.4   4.0 4.0 3.9 R   Chloride 102 101   104 105 103 R   Calcium  9.7 9.6   9.0 9.2 9.2 R   Phosphorus 3.0 2.9   3.8 3.9    Total Protein 7.0 6.9   6.6 6.6 7.5 R   Albumin 5.0 4.7   4.4 R 4.6 R 4.6 R   Globulin, Total 2.0 2.2   2.2 2.0    Bilirubin Total 0.8 1.0   0.2 0.7 0.9 R   Alkaline Phosphatase 75 74   73 63 57 R   LDH 176 170   181 151    AST 34 25   43 High  23 35 R   ALT 26 27   46 High  20 40 R   GGT 15 21   14 20     Iron 269 High Panic  245 High    127 141    Cholesterol, Total 174 182  101 168 161    Triglycerides 85 67  54 224 High  53    HDL 49 59  13 Low  34 Low  51    VLDL Cholesterol Cal 16 13  13  38 11    LDL Chol Calc (NIH) 109 High  110 High   75 96 99     Chol/HDL Ratio 3.6 3.1 CM  7.8 High  CM 4.9 CM 3.2 CM    Comment:                                   T. Chol/HDL Ratio                                             Men  Women                               1/2 Avg.Risk  3.4    3.3                                   Avg.Risk  5.0    4.4                                2X Avg.Risk  9.6    7.1                                3X Avg.Risk 23.4   11.0  Estimated CHD Risk 0.6  < 0.5 CM   1.0 CM  < 0.5 CM    Comment: The CHD Risk is based on the T. Chol/HDL ratio. Other factors affect CHD Risk such as hypertension, smoking, diabetes, severe obesity, and family history of premature CHD.  TSH 1.090 1.120   2.530 1.560    T4, Total 6.3 5.8   4.6 7.1    T3 Uptake Ratio 31 28   32 33    Free Thyroxine Index 2.0 1.6   1.5 2.3    WBC 7.0 7.0 9.3  7.0 6.7  7.0 R  RBC 5.12 4.86 4.90  4.82 4.76  4.75 R  Hemoglobin 16.7 15.7 16.1  15.8 16.1  15.5 R  Hematocrit 49.3 44.8 46.0  45.9 45.3  42.6 R  MCV 96 92 94  95 95  89.7 R  MCH 32.6 32.3 32.9  32.8 33.8 High   32.6 R  MCHC 33.9 35.0 35.0  34.4 35.5  36.4 High  R  RDW 12.4 11.9 12.0  12.2 12.3  11.6 R  Platelets 232 217 217  250 201  235 R  Neutrophils 45 44 49  46 46    Lymphs 42 48 41  42 42    Monocytes 11 8 9  10 11     Eos 1 0 0  1 1    Basos 1 0 1  1 0    Neutrophils Absolute 3.2 3.1 4.6  3.2 3.0    Lymphocytes Absolute 2.9 3.3 High  3.8 High   2.9 2.8    Monocytes Absolute 0.8 0.6 0.8  0.7 0.7    EOS (ABSOLUTE) 0.0 0.0 0.0  0.1 0.1    Basophils Absolute 0.0 0.0 0.1  0.0 0.0    Immature Granulocytes 0 0 0  0 0    Immature Grans (Abs) 0.0 0.0 0.0  0.0 0.0  ____________________________________________    ____________________________________________   INITIAL IMPRESSION / ASSESSMENT AND PLAn As part of my medical decision making, I reviewed the following data within the electronic MEDICAL RECORD NUMBER       No acute findings on physical exam.  Patient iron levels are markedly  elevated.  Will follow-up with a iron panel      ____________________________________________   FINAL CLINICAL IMPRESSION  Well exam   ED Discharge Orders     None        Note:  This document was prepared using Dragon voice recognition software and may include unintentional dictation errors.

## 2023-09-28 NOTE — Progress Notes (Signed)
 Here for yearly physical with sworn [employer].  Denies any complaints.

## 2023-09-30 DIAGNOSIS — M62830 Muscle spasm of back: Secondary | ICD-10-CM | POA: Diagnosis not present

## 2023-09-30 DIAGNOSIS — M9905 Segmental and somatic dysfunction of pelvic region: Secondary | ICD-10-CM | POA: Diagnosis not present

## 2023-09-30 DIAGNOSIS — M9901 Segmental and somatic dysfunction of cervical region: Secondary | ICD-10-CM | POA: Diagnosis not present

## 2023-09-30 DIAGNOSIS — M955 Acquired deformity of pelvis: Secondary | ICD-10-CM | POA: Diagnosis not present

## 2023-09-30 LAB — IRON,TIBC AND FERRITIN PANEL
Ferritin: 216 ng/mL (ref 30–400)
Iron Saturation: 80 % (ref 15–55)
Iron: 191 ug/dL — ABNORMAL HIGH (ref 38–169)
Total Iron Binding Capacity: 238 ug/dL — ABNORMAL LOW (ref 250–450)
UIBC: 47 ug/dL — ABNORMAL LOW (ref 111–343)

## 2023-10-05 DIAGNOSIS — M62830 Muscle spasm of back: Secondary | ICD-10-CM | POA: Diagnosis not present

## 2023-10-05 DIAGNOSIS — M9901 Segmental and somatic dysfunction of cervical region: Secondary | ICD-10-CM | POA: Diagnosis not present

## 2023-10-05 DIAGNOSIS — M9905 Segmental and somatic dysfunction of pelvic region: Secondary | ICD-10-CM | POA: Diagnosis not present

## 2023-10-05 DIAGNOSIS — M955 Acquired deformity of pelvis: Secondary | ICD-10-CM | POA: Diagnosis not present

## 2023-10-15 DIAGNOSIS — M955 Acquired deformity of pelvis: Secondary | ICD-10-CM | POA: Diagnosis not present

## 2023-10-15 DIAGNOSIS — M62830 Muscle spasm of back: Secondary | ICD-10-CM | POA: Diagnosis not present

## 2023-10-15 DIAGNOSIS — M9905 Segmental and somatic dysfunction of pelvic region: Secondary | ICD-10-CM | POA: Diagnosis not present

## 2023-10-15 DIAGNOSIS — M9901 Segmental and somatic dysfunction of cervical region: Secondary | ICD-10-CM | POA: Diagnosis not present

## 2023-12-01 ENCOUNTER — Other Ambulatory Visit: Payer: Self-pay

## 2023-12-01 DIAGNOSIS — B009 Herpesviral infection, unspecified: Secondary | ICD-10-CM

## 2023-12-01 MED ORDER — VALACYCLOVIR HCL 1 G PO TABS
1000.0000 mg | ORAL_TABLET | Freq: Two times a day (BID) | ORAL | 3 refills | Status: AC
Start: 2023-12-01 — End: ?

## 2023-12-18 DIAGNOSIS — M79671 Pain in right foot: Secondary | ICD-10-CM | POA: Diagnosis not present

## 2023-12-29 DIAGNOSIS — M79671 Pain in right foot: Secondary | ICD-10-CM | POA: Diagnosis not present

## 2024-01-08 DIAGNOSIS — M7661 Achilles tendinitis, right leg: Secondary | ICD-10-CM | POA: Diagnosis not present

## 2024-01-08 DIAGNOSIS — S86011A Strain of right Achilles tendon, initial encounter: Secondary | ICD-10-CM | POA: Diagnosis not present
